# Patient Record
Sex: Male | Born: 1965 | Race: White | Hispanic: No | Marital: Married | Smoking: Former smoker
Health system: Southern US, Community
[De-identification: ages and names within clinical notes are randomized; demographics above are authoritative.]

## PROBLEM LIST (undated history)

## (undated) DIAGNOSIS — M5136 Other intervertebral disc degeneration, lumbar region: Secondary | ICD-10-CM

## (undated) DIAGNOSIS — F419 Anxiety disorder, unspecified: Secondary | ICD-10-CM

## (undated) DIAGNOSIS — R7303 Prediabetes: Secondary | ICD-10-CM

## (undated) DIAGNOSIS — K759 Inflammatory liver disease, unspecified: Secondary | ICD-10-CM

## (undated) DIAGNOSIS — B1910 Unspecified viral hepatitis B without hepatic coma: Secondary | ICD-10-CM

## (undated) DIAGNOSIS — K219 Gastro-esophageal reflux disease without esophagitis: Secondary | ICD-10-CM

## (undated) DIAGNOSIS — N400 Enlarged prostate without lower urinary tract symptoms: Secondary | ICD-10-CM

## (undated) DIAGNOSIS — M51369 Other intervertebral disc degeneration, lumbar region without mention of lumbar back pain or lower extremity pain: Secondary | ICD-10-CM

## (undated) HISTORY — PX: KNEE ARTHROSCOPY: SHX127

## (undated) HISTORY — PX: CERVICAL DISCECTOMY: SHX98

## (undated) HISTORY — PX: INGUINAL HERNIA REPAIR: SHX194

## (undated) HISTORY — PX: LIPOMA EXCISION: SHX5283

## (undated) HISTORY — PX: TONSILLECTOMY: SUR1361

## (undated) HISTORY — PX: LUMBAR DISC SURGERY: SHX700

---
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
## 2003-08-03 HISTORY — PX: CERVICAL DISCECTOMY: SHX98

## 2004-07-02 IMAGING — CR DG CHEST 2V
3 series · 3 of 3 positions shown · non-contrast
Comparison: none

CLINICAL DATA: 38 year-old for pre-op respiratory exam.  Herniated disc at C6-7.
 TWO VIEWS OF THE CHEST: 
 Two views of the chest without prior studies for comparison demonstrate cardiac silhouette, mediastinal and hilar contours to be within normal limits.  No acute pulmonary findings.  There is a vague rounded nodular density at the left lung base, but I think this is most likely patient?s nipple, but given his history of smoking I would recommend a follow-up PA chest with nipple markers to confirm this.

[view not recorded (1 of 3)]
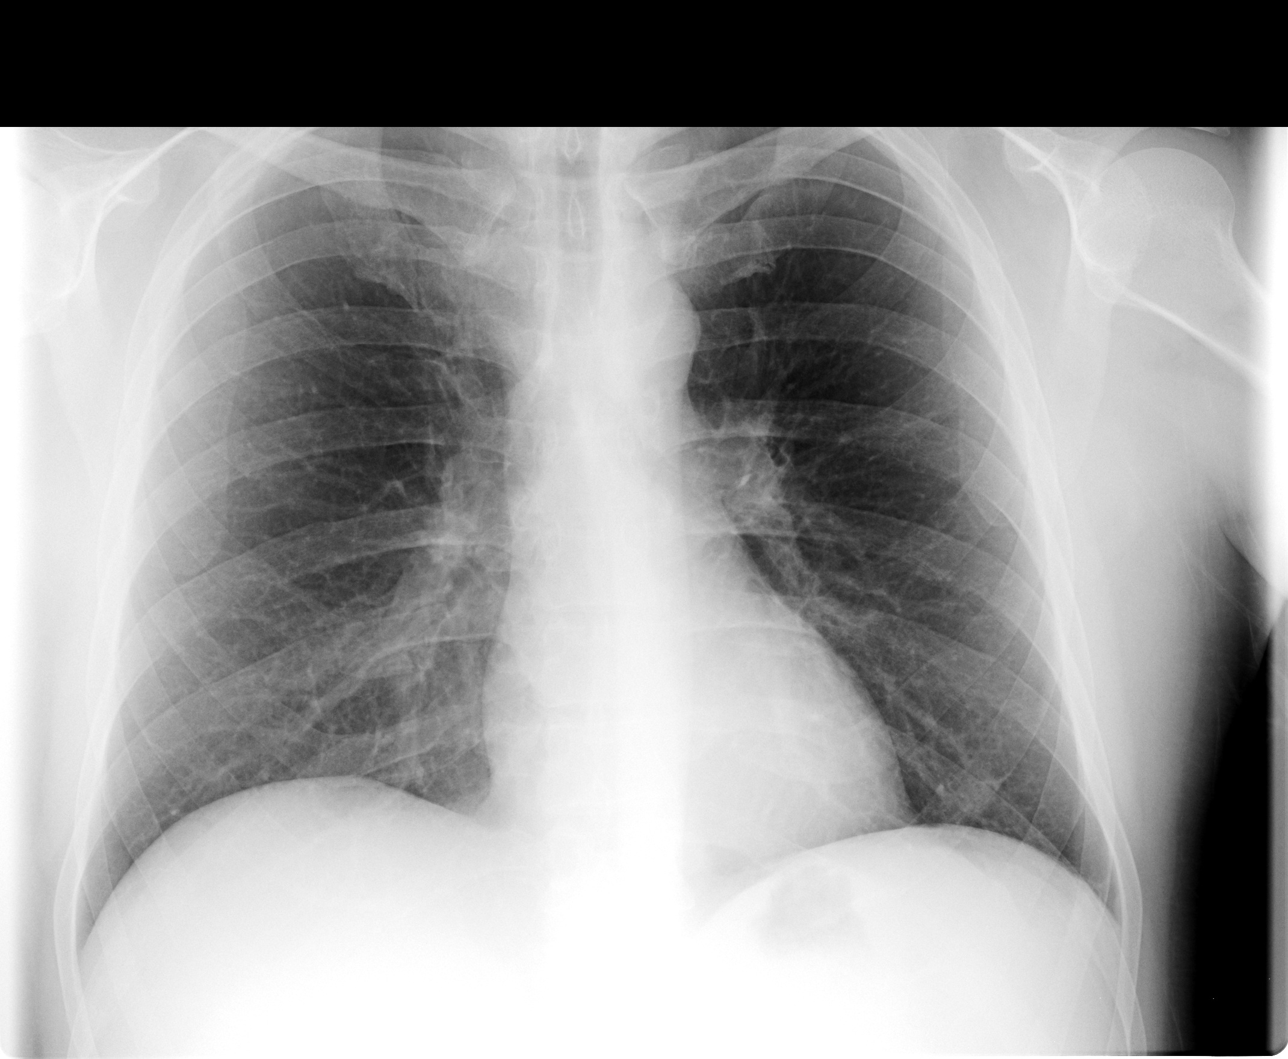

[view not recorded (2 of 3)]
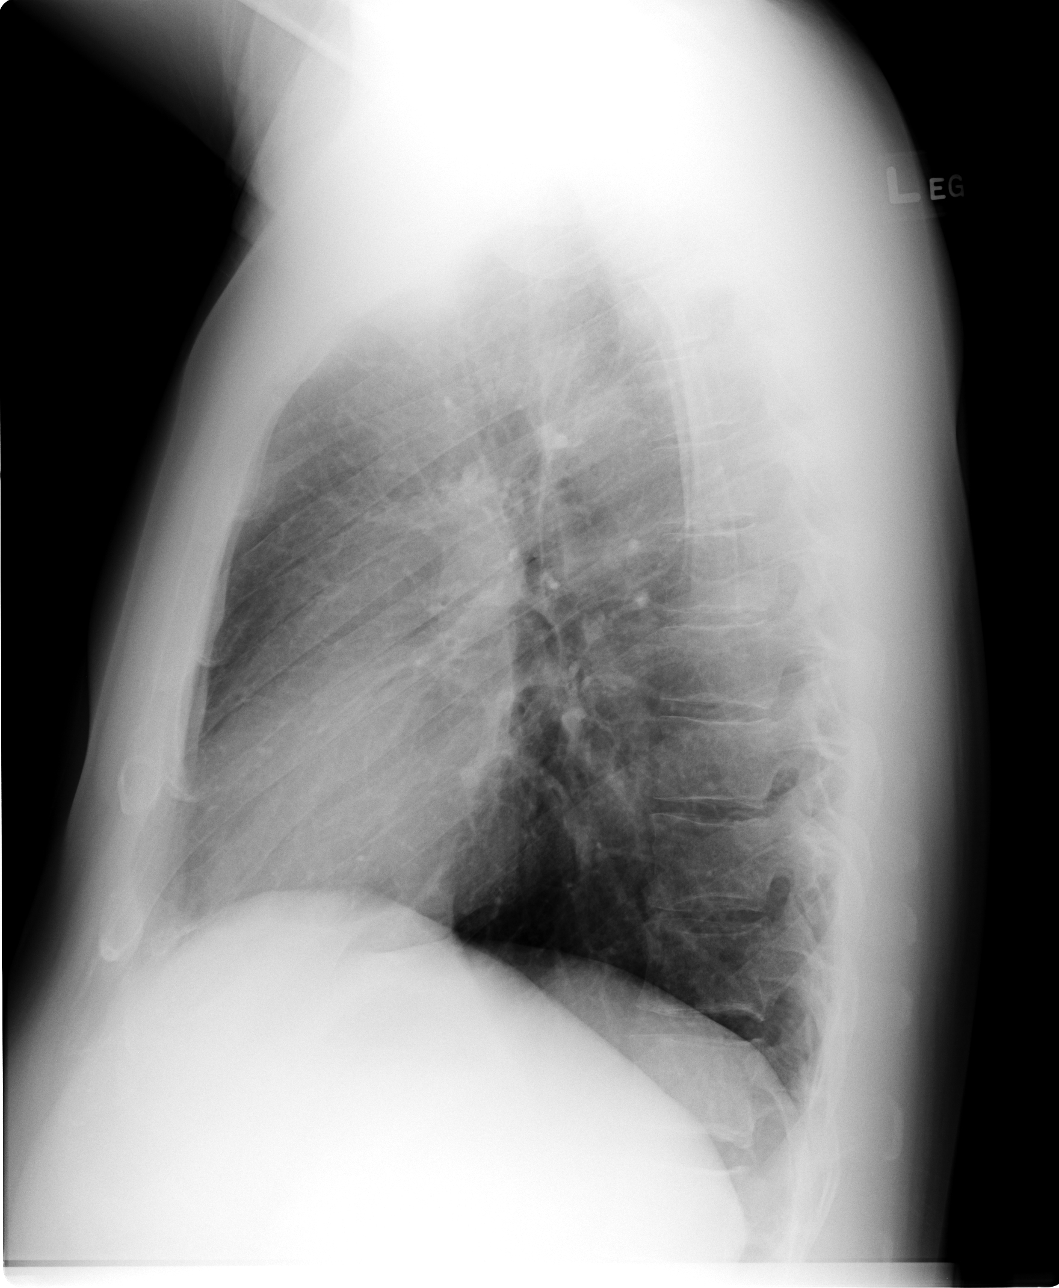

[view not recorded (3 of 3)]
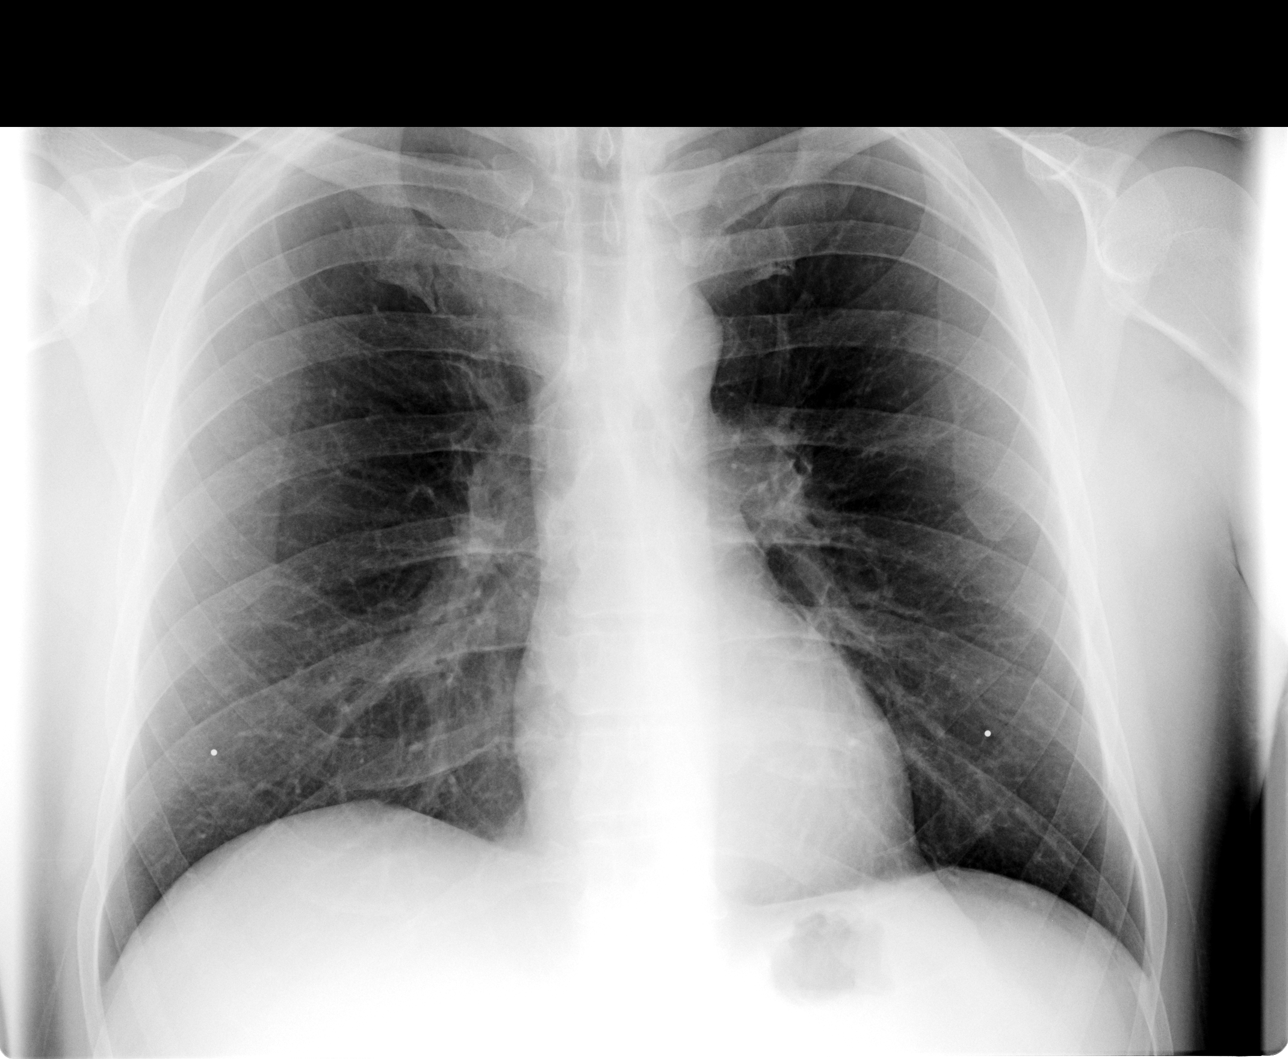

[3 of 3 positions shown; findings below may reference images not displayed]

IMPRESSION: 1. No acute cardiopulmonary findings.
 2. Rounded nodular density at the left lung base is most likely patient?s left nipple but I would recommend a follow-up PA chest with nipple markers to confirm this.

## 2004-07-03 ENCOUNTER — Ambulatory Visit (HOSPITAL_COMMUNITY): Admission: RE | Admit: 2004-07-03 | Discharge: 2004-07-04 | Payer: Self-pay | Admitting: Orthopaedic Surgery

## 2007-06-08 IMAGING — CT CT ABD-PELV W/O CM
3 of 7 series · 13 of 42 positions shown, 19 images · IV contrast (CONTRAST)
Comparison: NONE

CLINICAL DATA: Abdominal cramps and constipation. 

CT ABDOMEN AND PELVIS WITHOUT AND WITH INTRAVENOUS AND FOLLOWING 
ORAL  CONTRAST
TECHNIQUE: Multiple axial 5.0-mm-thick slices at 5.0-mm 
intervals were obtained from the lung base through the pelvis 
following the intravenous administration of 97 cc of Optiray 350 
at a rate of 3 cc per second.  Oral contrast was administered as 
well.  Arterial and venous phase imaging was obtained in the upper 
abdomen with delayed images obtained through the pelvis.

[Series 4: venous · axial · portal-venous · 0.83mm/px · z∈[+693,+1043]mm · 5 of 106 slices shown, 10 images]
[im 18/106  soft-tissue]
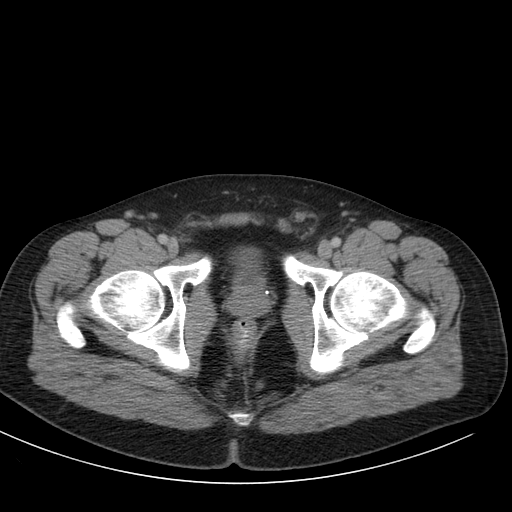
[im 18/106  bone]
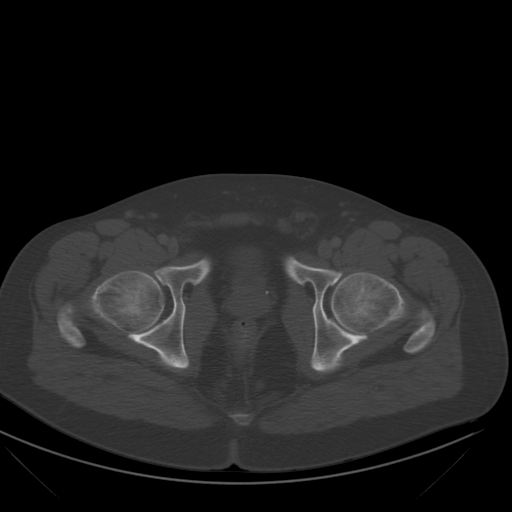
[im 36/106  soft-tissue]
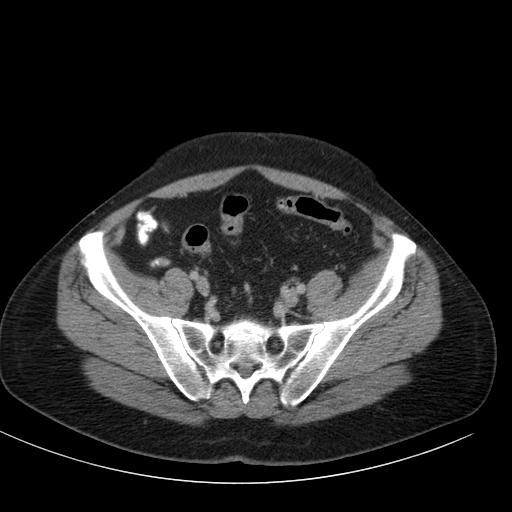
[im 36/106  lung]
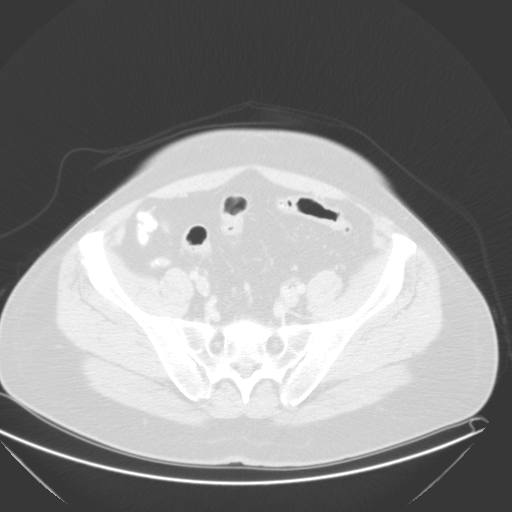
[im 53/106  soft-tissue]
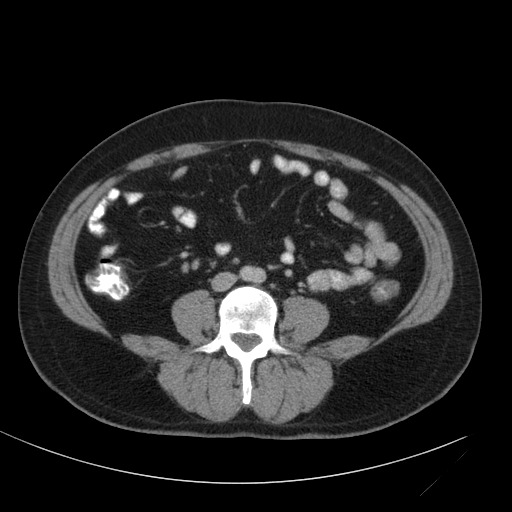
[im 53/106  lung]
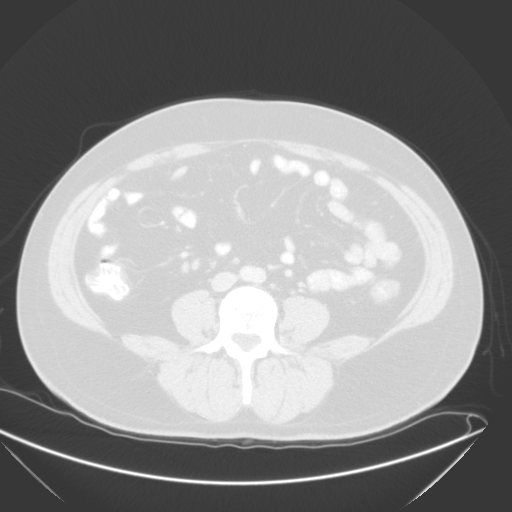
[im 71/106  soft-tissue]
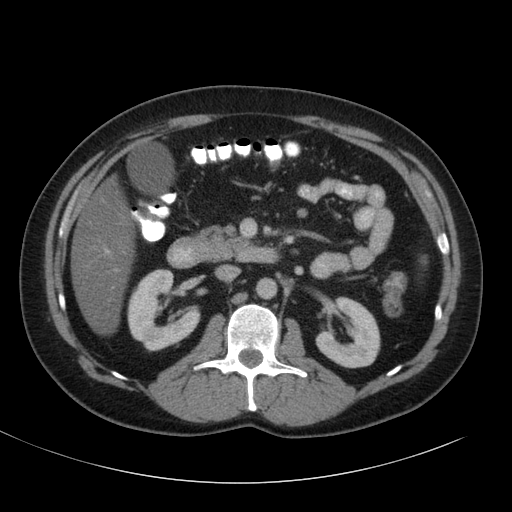
[im 71/106  lung]
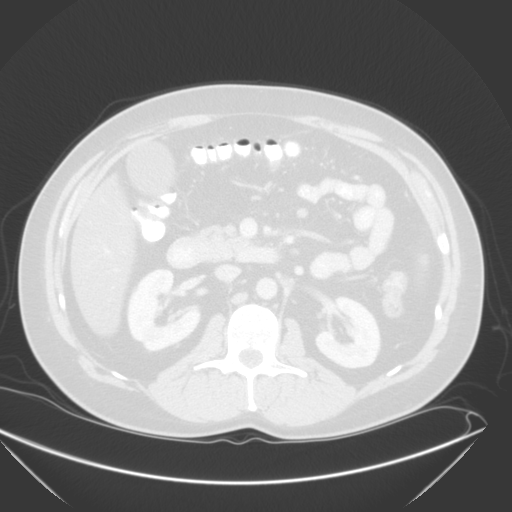
[im 88/106  soft-tissue]
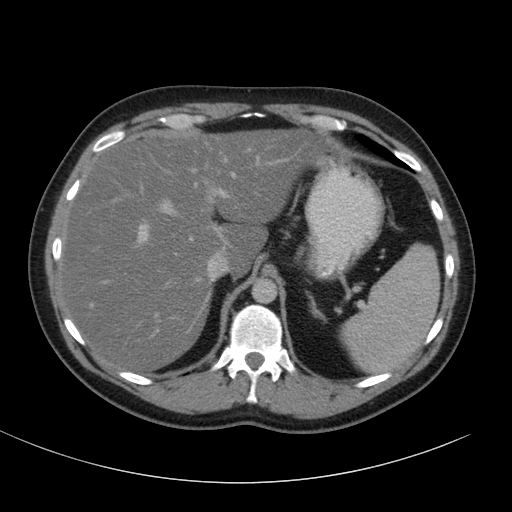
[im 88/106  lung]
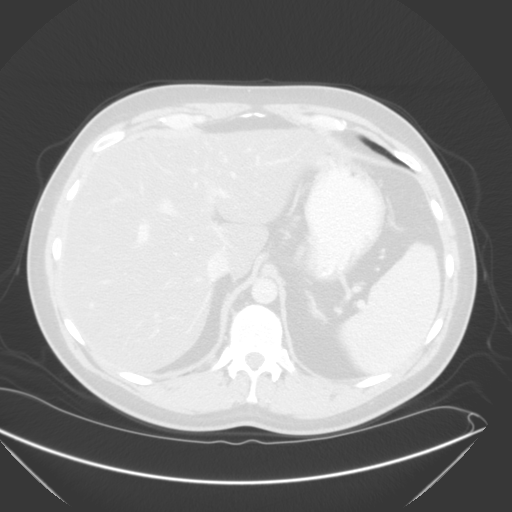

[Series 9: delays · axial · 0.83mm/px · z∈[+692,+1012]mm · 5 of 97 slices shown]
[im 17/97  soft-tissue]
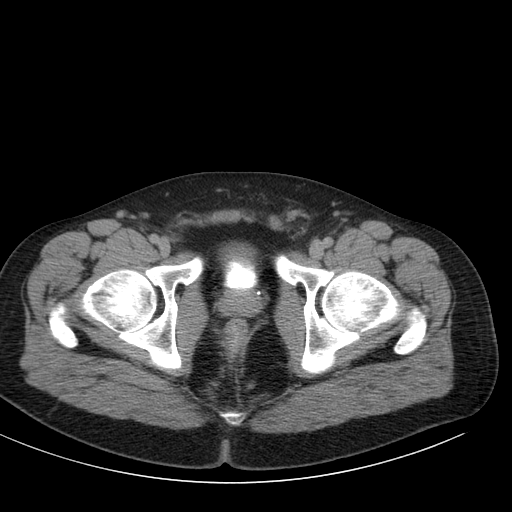
[im 33/97  soft-tissue]
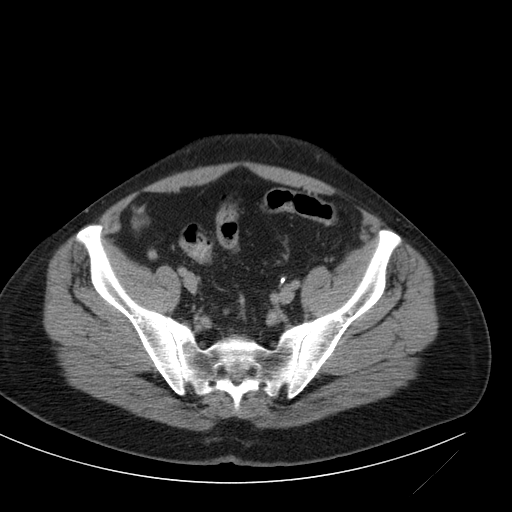
[im 49/97  soft-tissue]
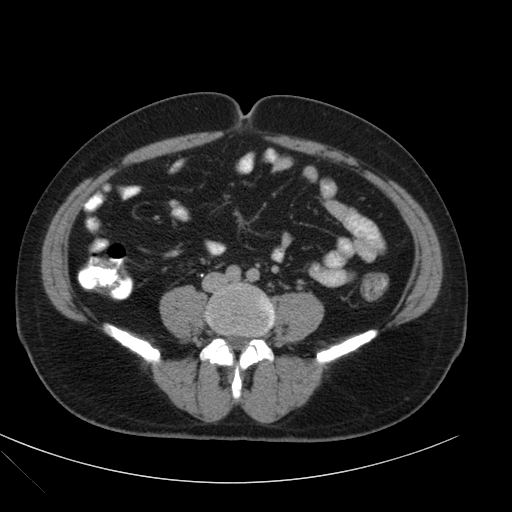
[im 65/97  soft-tissue]
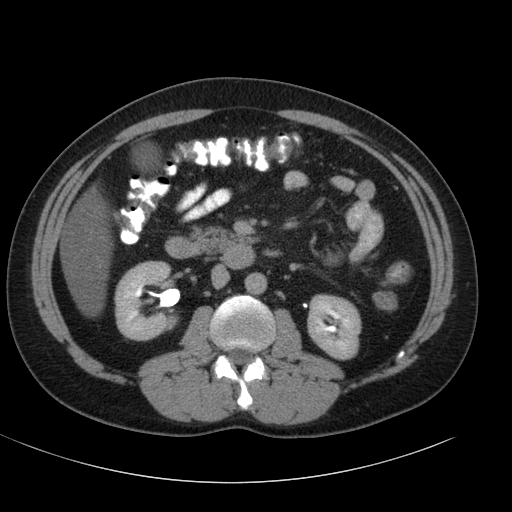
[im 81/97  soft-tissue]
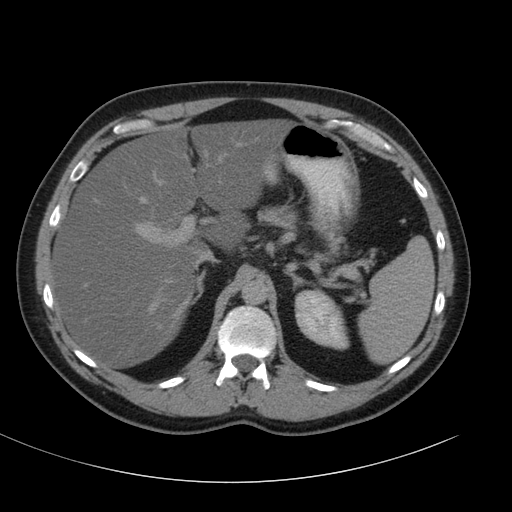

[coronals · coronal · 1.02mm/px · 3 of 76 slices shown, 4 images]
[im 26/76  soft-tissue]
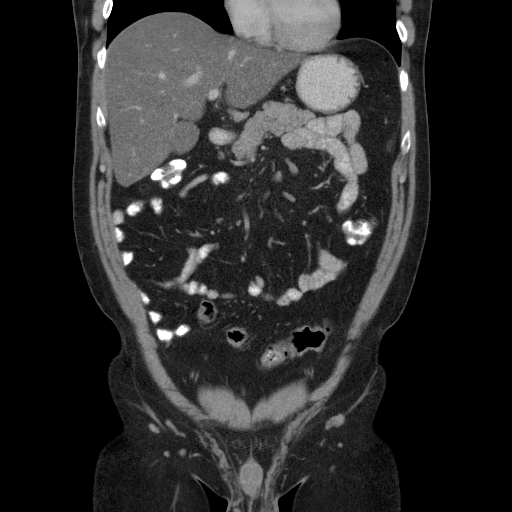
[im 34/76  soft-tissue]
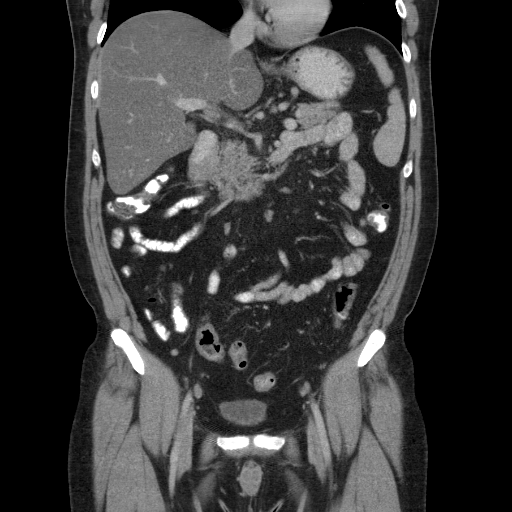
[im 34/76  bone]
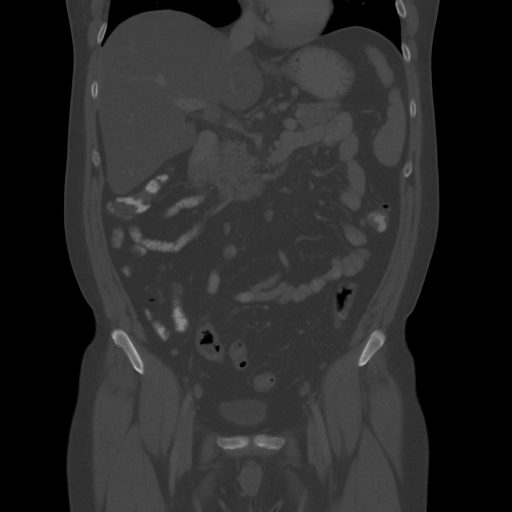
[im 42/76  soft-tissue]
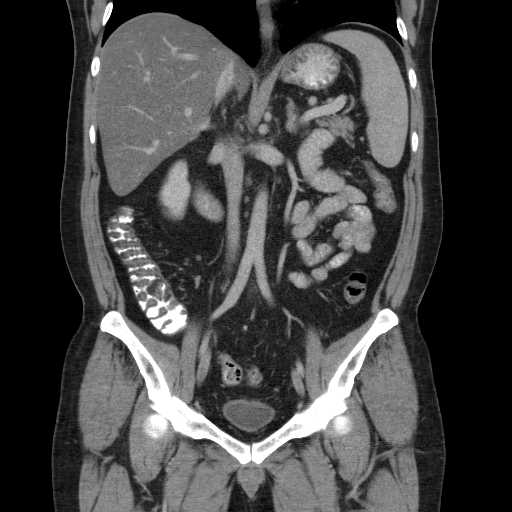

[13 of 42 positions shown; findings below may reference images not displayed]

FINDINGS: No gallstones or renal calculi.  Diffuse low density in 
the liver without focal mass is noted.  The pancreas and spleen 
are unremarkable.  No upper abdominal or retroperitoneal mass, 
adenopathy, or aneurysm.  No adrenal or renal mass or renal 
obstruction.  No bowel, mesenteric, pelvic, or inguinal mass, 
adenopathy, or inflammatory process.  No evidence of appendicitis, 
diverticulitis, hernia, or bowel obstruction. No lung base mass, 
infiltrate, edema, or effusion. No lytic or blastic lesions are 
identified.
IMPRESSION: Low density in the liver consistent with fatty 
infiltration, otherwise negative study. EIMY 
electronically reviewed on [DATE] Dict Date: [DATE]  Tran 
Date:  [DATE] DAS  JLM

## 2007-06-23 ENCOUNTER — Ambulatory Visit: Payer: Self-pay | Admitting: Gastroenterology

## 2007-06-23 LAB — CONVERTED CEMR LAB
HEP B PCR: 1680 — ABNORMAL HIGH (ref <100)
Hgb A1c MFr Bld: 5.3 % (ref 4.6–6.0)
INR: 1 (ref 0.8–1.0)
Prothrombin Time: 12.1 s (ref 10.9–13.3)
aPTT: 22.6 s (ref 21.7–29.8)

## 2007-08-09 ENCOUNTER — Ambulatory Visit: Payer: Self-pay | Admitting: Gastroenterology

## 2007-08-09 LAB — CONVERTED CEMR LAB
Basophils Absolute: 0 10*3/uL (ref 0.0–0.1)
Basophils Relative: 0.1 % (ref 0.0–1.0)
Eosinophils Absolute: 0.2 10*3/uL (ref 0.0–0.6)
Eosinophils Relative: 2.7 % (ref 0.0–5.0)
HCT: 45.8 % (ref 39.0–52.0)
Hemoglobin: 16.2 g/dL (ref 13.0–17.0)
Lymphocytes Relative: 41.4 % (ref 12.0–46.0)
MCHC: 35.3 g/dL (ref 30.0–36.0)
MCV: 91.4 fL (ref 78.0–100.0)
Monocytes Absolute: 0.5 10*3/uL (ref 0.2–0.7)
Monocytes Relative: 5.4 % (ref 3.0–11.0)
Neutro Abs: 4.5 10*3/uL (ref 1.4–7.7)
Neutrophils Relative %: 50.4 % (ref 43.0–77.0)
Platelets: 220 10*3/uL (ref 150–400)
RBC: 5.01 M/uL (ref 4.22–5.81)
RDW: 11.3 % — ABNORMAL LOW (ref 11.5–14.6)
TSH: 0.95 microintl units/mL (ref 0.35–5.50)
WBC: 8.8 10*3/uL (ref 4.5–10.5)

## 2007-10-12 ENCOUNTER — Ambulatory Visit: Payer: Self-pay | Admitting: Gastroenterology

## 2007-10-16 DIAGNOSIS — B1910 Unspecified viral hepatitis B without hepatic coma: Secondary | ICD-10-CM | POA: Insufficient documentation

## 2007-10-17 ENCOUNTER — Ambulatory Visit: Payer: Self-pay | Admitting: Gastroenterology

## 2007-10-26 ENCOUNTER — Ambulatory Visit: Payer: Self-pay | Admitting: Gastroenterology

## 2007-10-26 ENCOUNTER — Encounter: Payer: Self-pay | Admitting: Gastroenterology

## 2007-10-30 DIAGNOSIS — F411 Generalized anxiety disorder: Secondary | ICD-10-CM | POA: Insufficient documentation

## 2007-10-30 DIAGNOSIS — B181 Chronic viral hepatitis B without delta-agent: Secondary | ICD-10-CM | POA: Insufficient documentation

## 2007-10-30 DIAGNOSIS — K7689 Other specified diseases of liver: Secondary | ICD-10-CM

## 2007-10-30 DIAGNOSIS — D126 Benign neoplasm of colon, unspecified: Secondary | ICD-10-CM

## 2007-11-21 ENCOUNTER — Ambulatory Visit: Payer: Self-pay | Admitting: Gastroenterology

## 2007-12-07 ENCOUNTER — Ambulatory Visit: Payer: Self-pay | Admitting: Gastroenterology

## 2007-12-18 DIAGNOSIS — K219 Gastro-esophageal reflux disease without esophagitis: Secondary | ICD-10-CM

## 2007-12-18 DIAGNOSIS — Z87898 Personal history of other specified conditions: Secondary | ICD-10-CM | POA: Insufficient documentation

## 2007-12-29 ENCOUNTER — Encounter (INDEPENDENT_AMBULATORY_CARE_PROVIDER_SITE_OTHER): Payer: Self-pay | Admitting: Interventional Radiology

## 2007-12-29 ENCOUNTER — Ambulatory Visit (HOSPITAL_COMMUNITY): Admission: RE | Admit: 2007-12-29 | Discharge: 2007-12-29 | Payer: Self-pay | Admitting: Obstetrics and Gynecology

## 2007-12-29 IMAGING — US US BIOPSY
1 series · 10 of 10 positions shown · non-contrast
Comparison: none

Clinical: Chronic hepatitis B; request is made for random core
liver biopsy

ULTRASOUND-GUIDED RANDOM CORE LIVER BIOPSY:
An ultrasound guided liver biopsy was thoroughly discussed with the
patient and questions were answered. The benefits, risks,
alternatives, and complications were also discussed. The patient
understands and wishes to proceed with the procedure. A verbal as
well as written consent was obtained.
Survey ultrasound of the liver was performed and an appropriate
skin entry site was determined. Skin site was marked, prepped with
Betadine, and draped in usual sterile fashion, and infiltrated
locally with 1% Lidocaine. 2 mg Intravenous Fentanyl and  100 mcg
IV Versed were administered as conscious sedation for 10 minutes
during continuous cardiorespiratory monitoring by the radiology RN.
A 17 gauge Trocar needle was advanced under ultrasound guidance
into the liver.  4 coaxial 18 gauge core samples were then obtained
through the guide needle. The guide needle was removed. Post
procedure scans demonstrate no apparent complication.

[Series 1: unknown · 0.33mm/px · 10 of 10 slices shown]
[im 1/10]
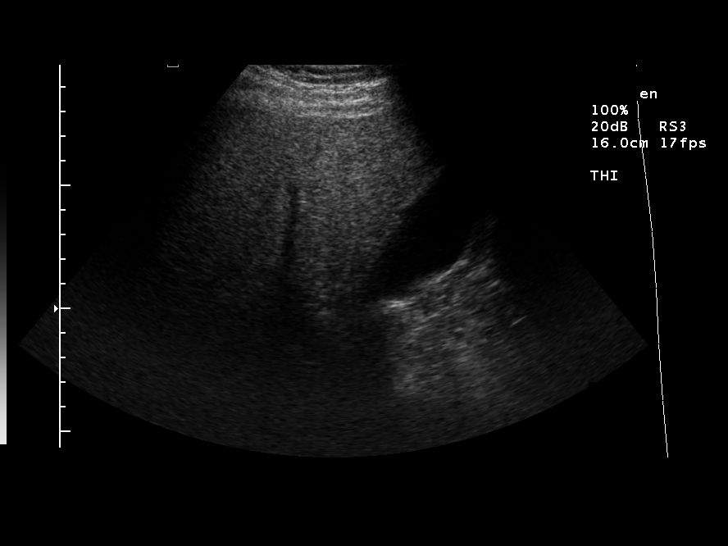
[im 2/10]
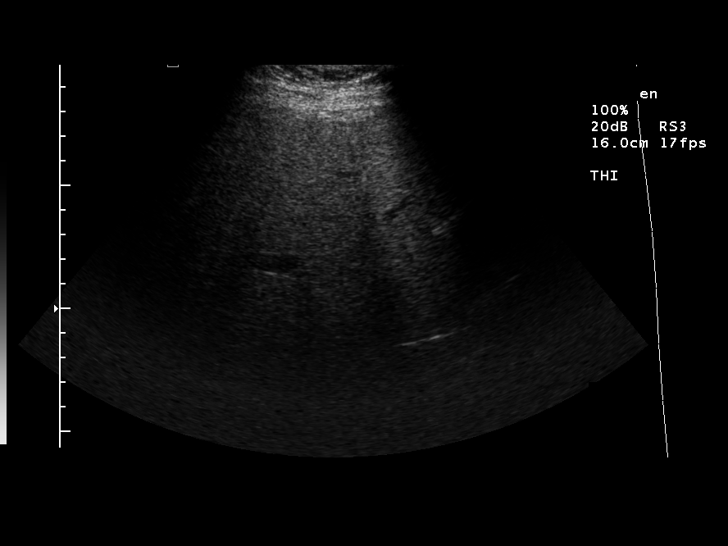
[im 3/10]
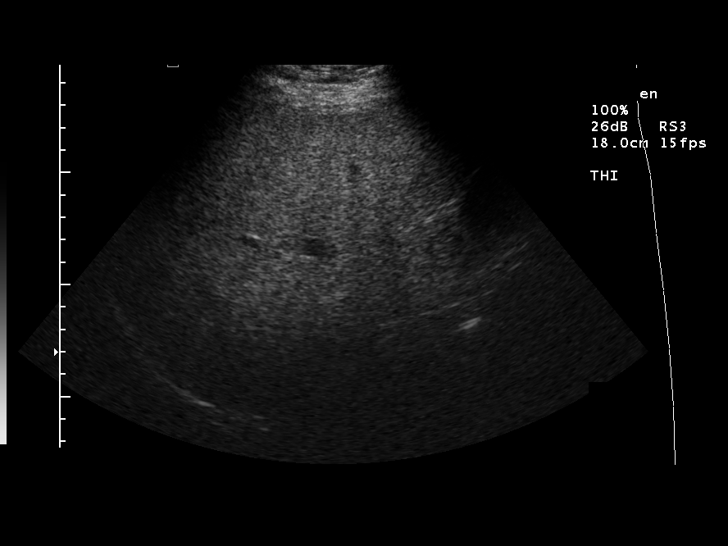
[im 4/10]
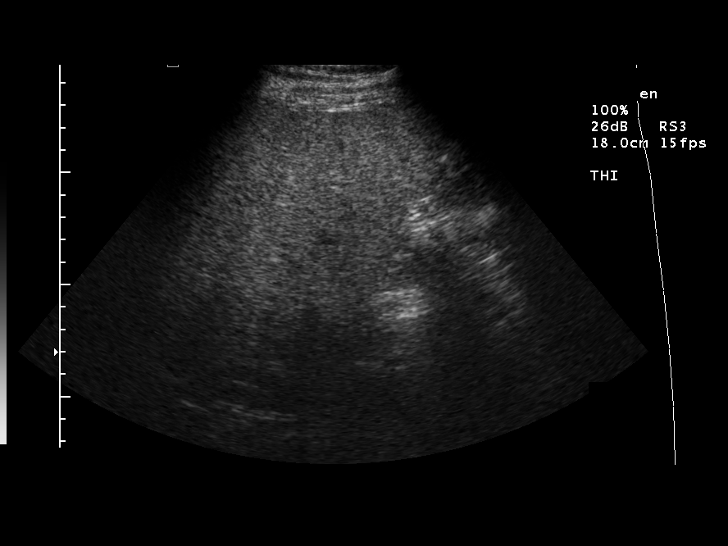
[im 5/10]
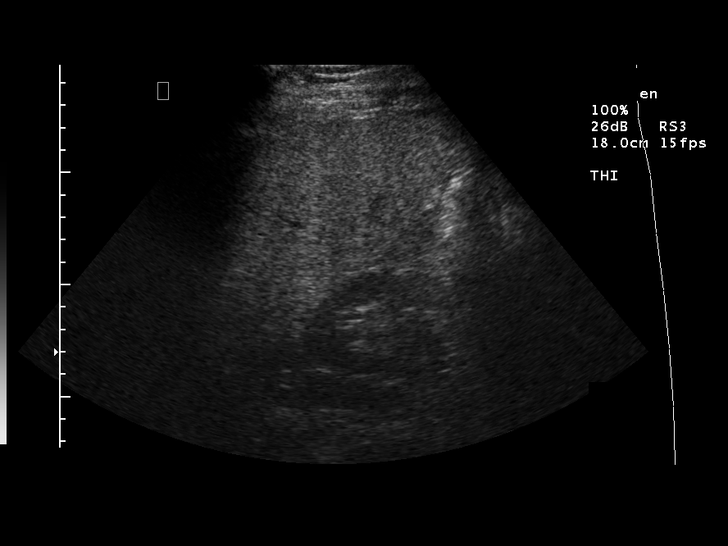
[im 6/10]
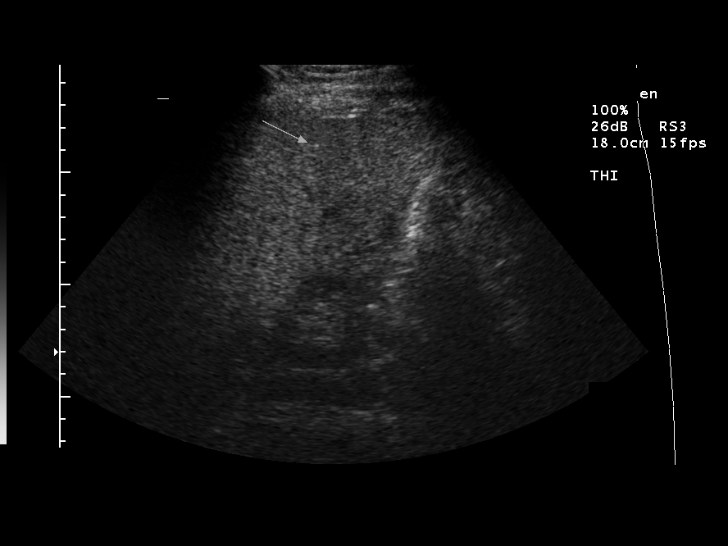
[im 7/10]
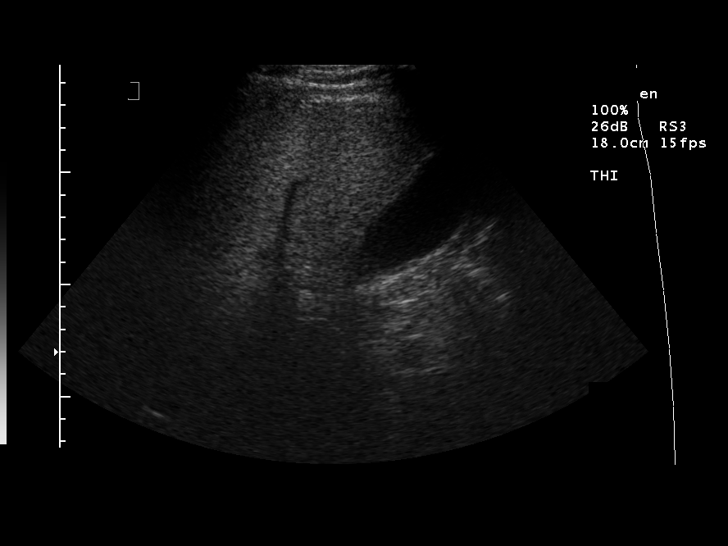
[im 8/10]
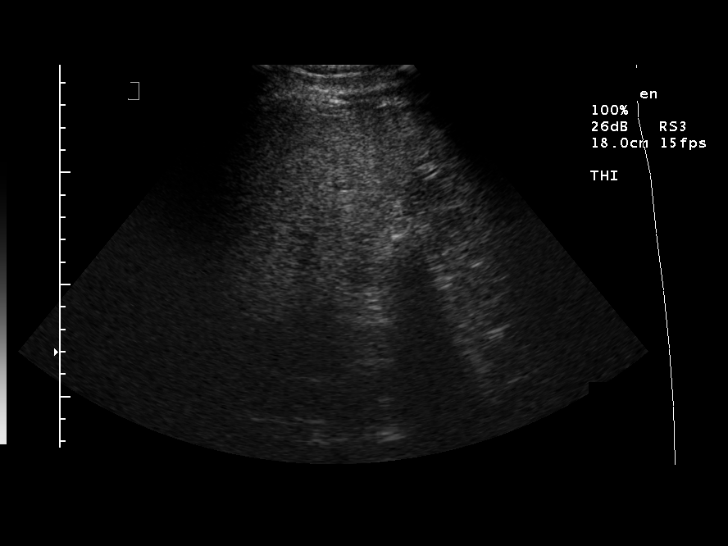
[im 9/10]
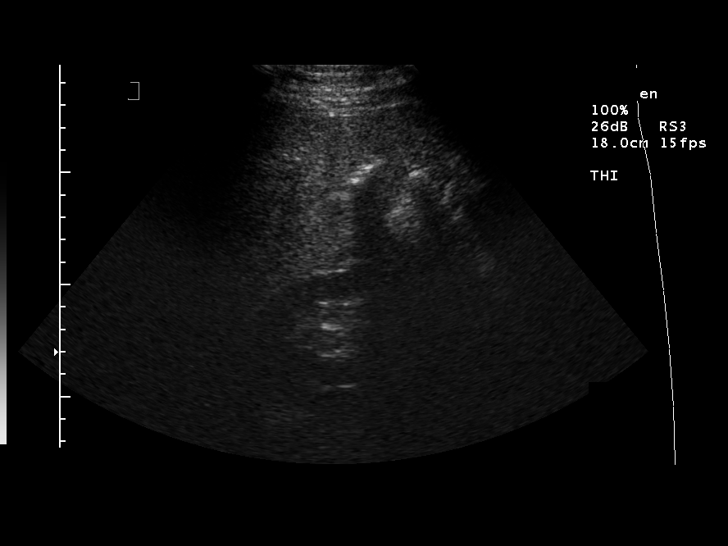
[im 10/10]
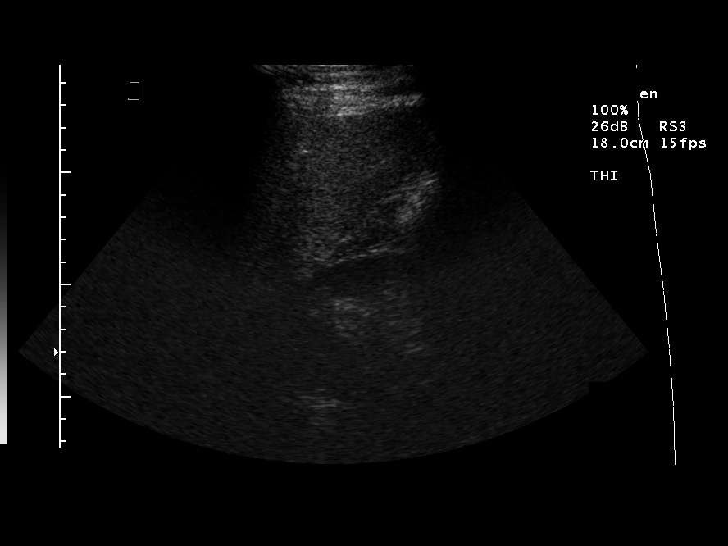

[10 of 10 positions shown; findings below may reference images not displayed]

IMPRESSION: 1. Technically successful ultrasound guided random core liver
biopsy.

Read by: MELTA.-MELTA

## 2008-02-08 ENCOUNTER — Ambulatory Visit: Payer: Self-pay | Admitting: Gastroenterology

## 2008-06-13 ENCOUNTER — Ambulatory Visit: Payer: Self-pay | Admitting: Gastroenterology

## 2008-11-26 ENCOUNTER — Ambulatory Visit: Payer: Self-pay | Admitting: Gastroenterology

## 2008-12-05 ENCOUNTER — Ambulatory Visit (HOSPITAL_COMMUNITY): Admission: RE | Admit: 2008-12-05 | Discharge: 2008-12-05 | Payer: Self-pay | Admitting: Gastroenterology

## 2008-12-05 IMAGING — US US ABDOMEN COMPLETE
1 series · 14 of 25 positions shown · non-contrast
Comparison: None

CLINICAL DATA: Hepatitis B.  Fatty liver.

COMPLETE ABDOMINAL ULTRASOUND

[Series 1: us abdomen complete · 0.35mm/px · 14 of 65 slices shown]
[im 1/65]
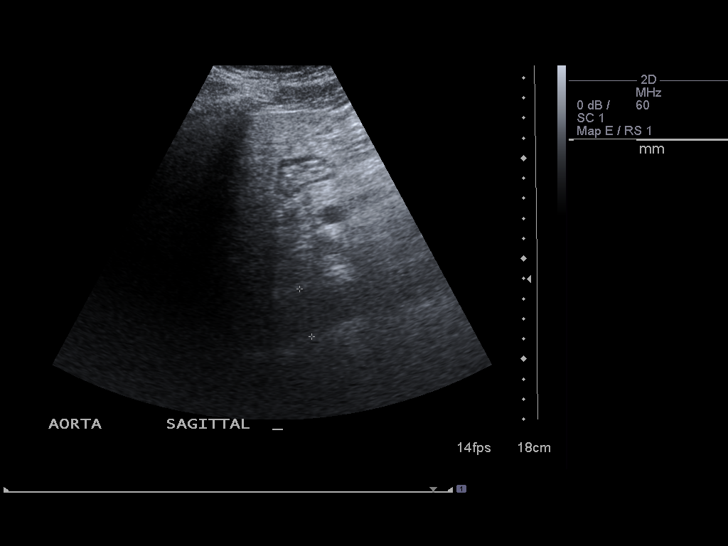
[im 6/65]
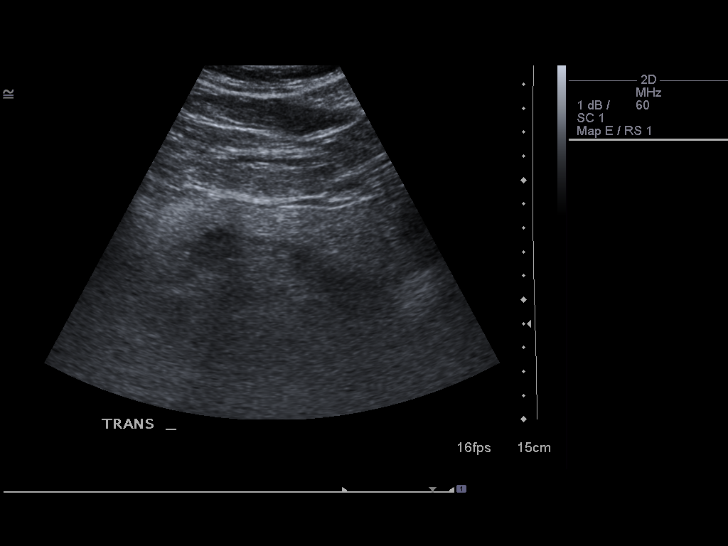
[im 11/65]
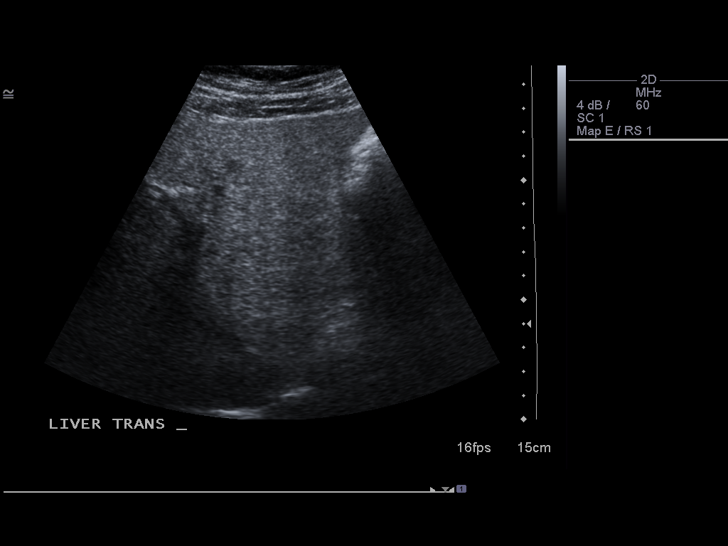
[im 17/65]
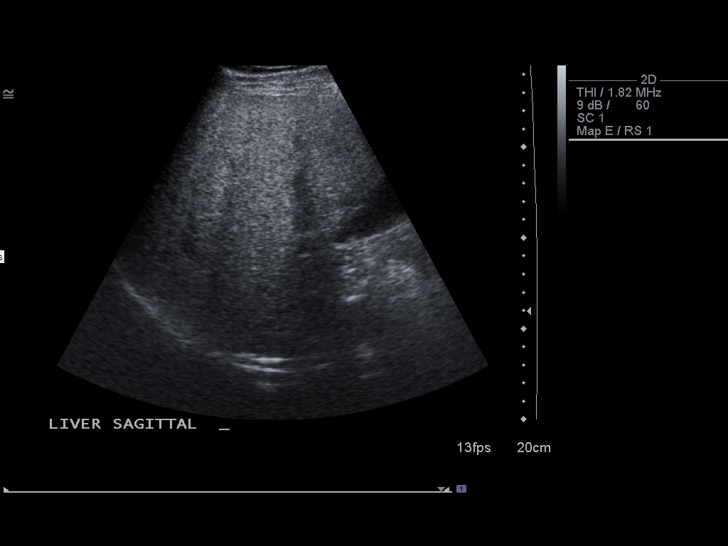
[im 22/65]
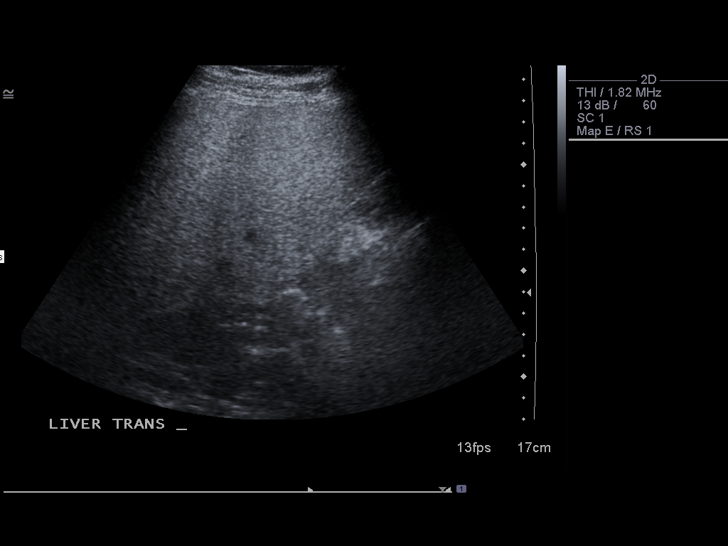
[im 25/65]
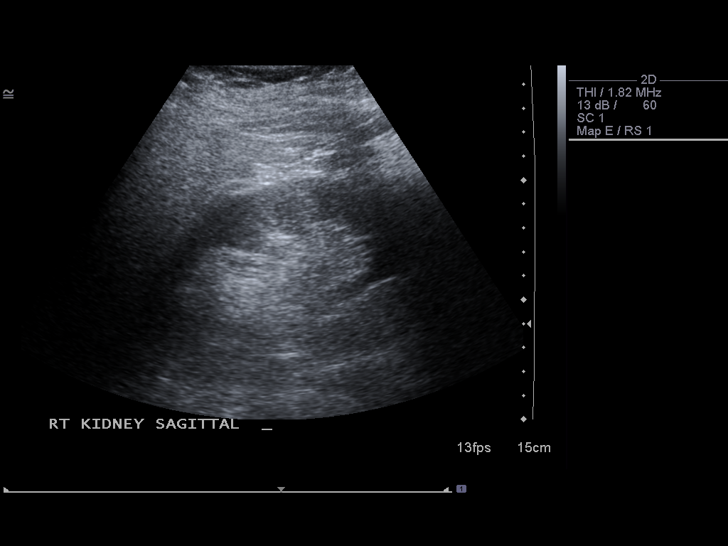
[im 30/65]
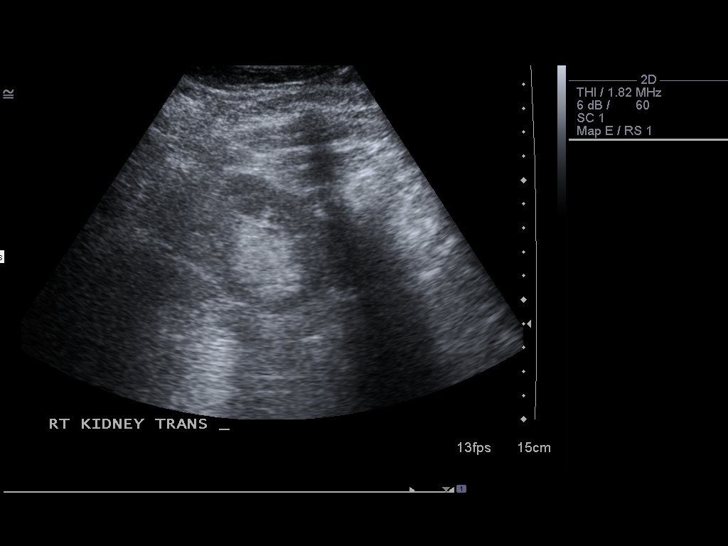
[im 35/65]
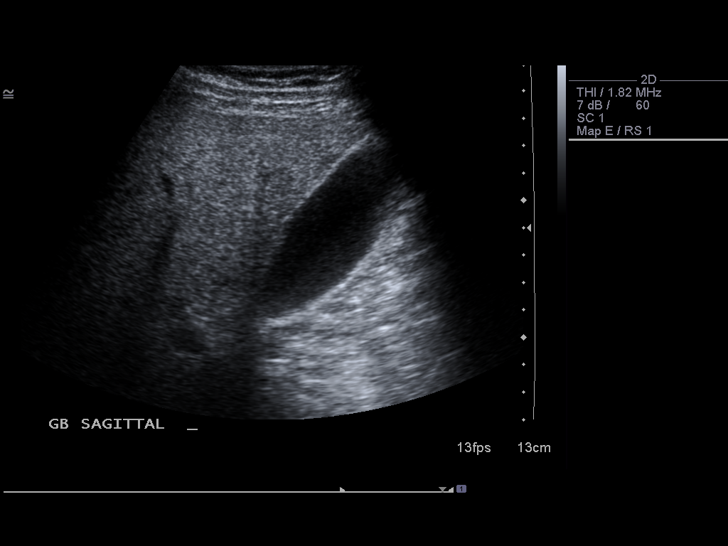
[im 41/65]
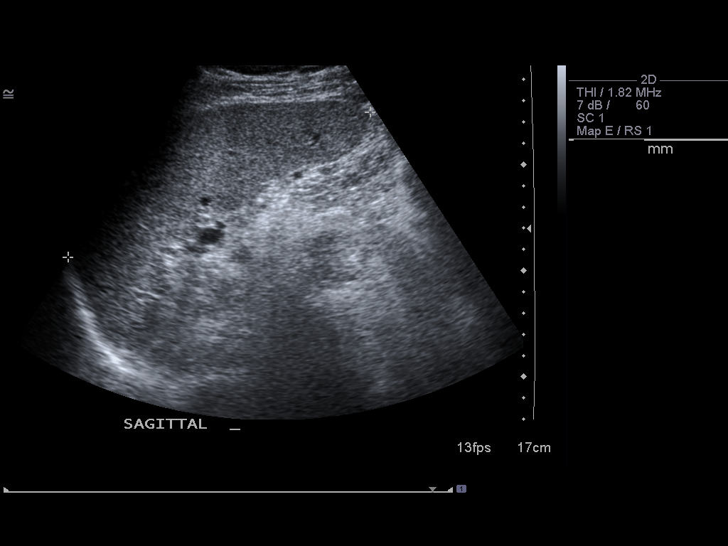
[im 43/65]
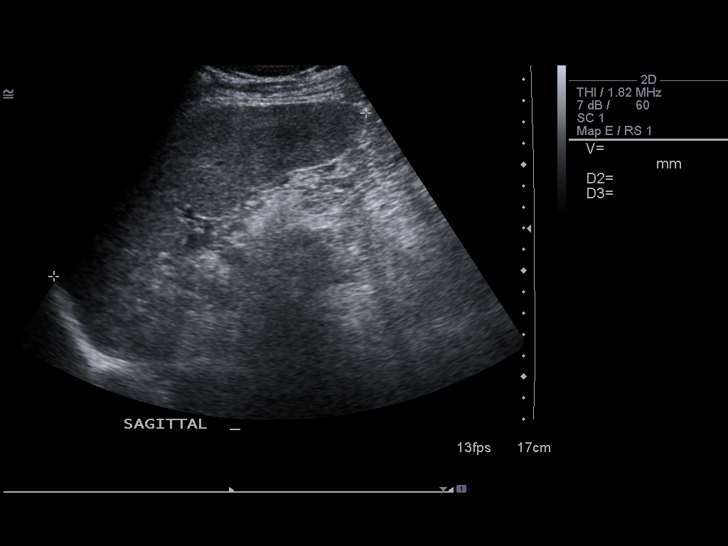
[im 49/65]
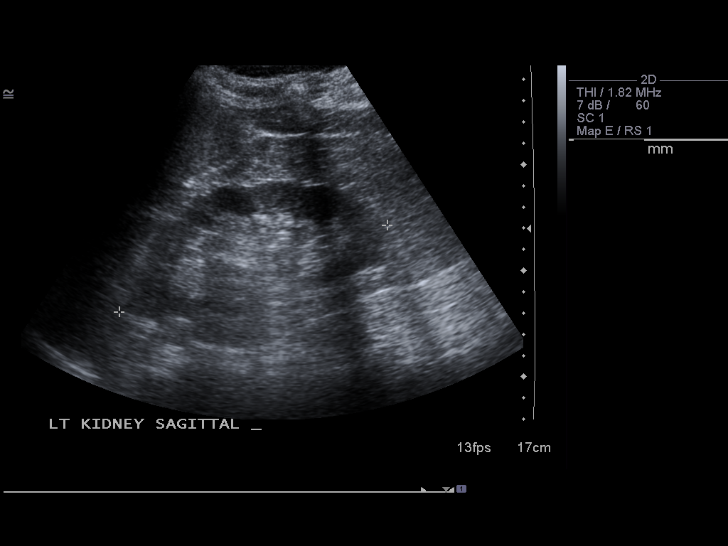
[im 54/65]
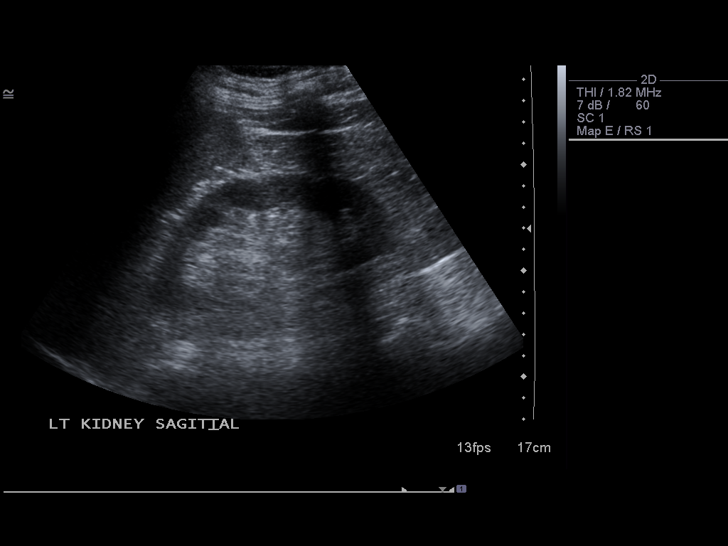
[im 59/65]
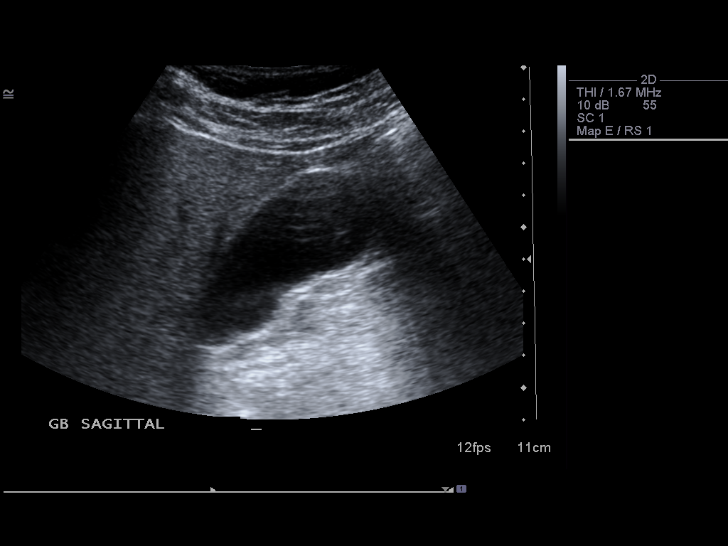
[im 65/65]
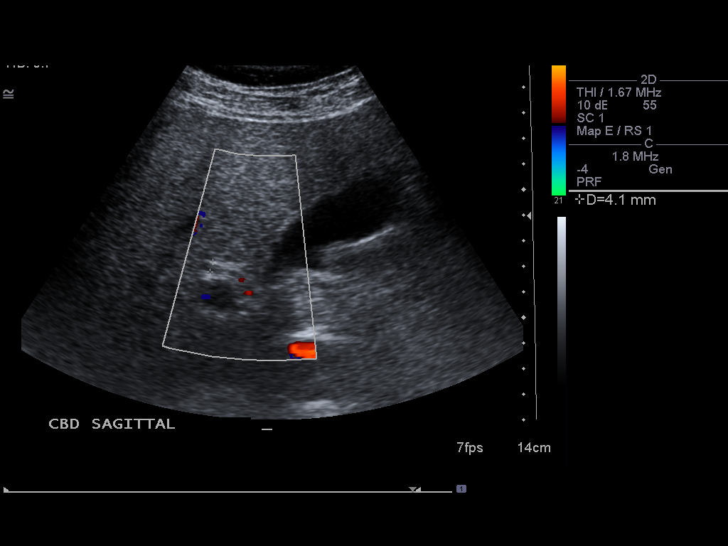

[14 of 25 positions shown; findings below may reference images not displayed]

FINDINGS: Gallbladder:  Gallbladder is sonographically normal without sludge
or gallstones with normal 3 mm wall thickness.

Common bile duct:  No dilated intrahepatic or extrahepatic bile
ducts seen with common bile duct measuring normally at 3 mm.

Liver:  Liver size is normal.  Increased liver echogenicity is
consistent with moderate fatty change with no focal lesion
identified.

IVC:  Sonographically normal.

Pancreas:  Sonographically normal.

Spleen:  Slight splenomegaly measuring 16.6 cm long (splenic volume
651 cc).

Right Kidney:  Sonographically normal measuring 12.1 cm long.

Left Kidney:  Sonographically normal measuring 13.3 cm long.

Abdominal aorta:  Sonographically normal with proximal abdominal
aortic diameter 2.5 cm.

Other Findings:  No free fluid noted.
IMPRESSION: 1.  Moderate diffuse fatty infiltration liver.
2.  Slight splenomegaly.
3.  Otherwise, negative.

## 2009-05-01 ENCOUNTER — Ambulatory Visit: Payer: Self-pay | Admitting: Gastroenterology

## 2009-12-25 ENCOUNTER — Encounter: Payer: Self-pay | Admitting: Gastroenterology

## 2009-12-25 ENCOUNTER — Ambulatory Visit: Payer: Self-pay | Admitting: Gastroenterology

## 2010-09-01 NOTE — Consult Note (Signed)
Summary: [facility]   Imported By: Lester [place] 01/14/2010 08:25:21  _____________________________________________________________________  External Attachment:    Type:   Image     Comment:   External Document

## 2010-12-15 NOTE — Letter (Signed)
June 23, 2007    Ann Held   RE:  LIOR, HOEN  MRN:  [id number]  /  DOB:  1966-02-22   Dear Mr. Epifanio Lesches:   It is my pleasure to have treated you recently as a new patient in my  office.  I appreciate your confidence and the opportunity to participate  in your care.   Since I do have a busy inpatient endoscopy schedule and office schedule,  my office hours vary weekly.  I am, however, available for emergency  calls every day through my office.  If I cannot promptly meet an urgent  office appointment, another one of our gastroenterologists will be able  to assist you.   My well-trained staff are prepared to help you at all times.  For  emergencies after office hours, a physician from our gastroenterology  section is always available through my 24-hour answering service.   While you are under my care, I encourage discussion of your questions  and concerns, and I will be happy to return your calls as soon as I am  available.   Once again, I welcome you as a new patient and I look forward to a happy  and healthy relationship.    Sincerely,      Barbette Hair. Arlyce Dice, MD,FACG  Electronically Signed   RDK/MedQ  DD: 06/23/2007  DT: 06/23/2007  Job #: [id number]

## 2010-12-15 NOTE — Assessment & Plan Note (Signed)
 HEALTHCARE                         GASTROENTEROLOGY OFFICE NOTE   Troy Shelton, Troy Shelton                       MRN:          [id number]  DATE:11/21/2007                            DOB:          Oct 22, 1965    PROBLEM:  Hepatitis B.   Troy Shelton has returned for scheduled GI followup.  He underwent  colonoscopy because of diarrhea, which demonstrated a hyperplastic polyp  only.  Serologies for hepatitis B were positive.  Hepatitis B  quantitative DNA was 1680, and calculative DNA 319 (which is  unequivocally positive).  Hemoglobin A1c level was 5.3.   Troy Shelton main complaint is fatigue.  This is a constant problem.  He  otherwise is feeling well on examination.  Pulse was 72, blood pressure  132/72, weight 214.   IMPRESSION:  1. Chronic hepatitis B.  At this point he will be followed by Dr.      Foy [place] for this problem.  2. Elevated glucose (141).  His normal A1c level mitigates against      diabetes.   RECOMMENDATIONS:  No further recommendations.  He will return to  gastroenterology as needed.     Barbette Hair. Arlyce Dice, MD,FACG  Electronically Signed    RDK/MedQ  DD: 11/21/2007  DT: 11/21/2007  Job #: [id number]   cc:   Darrin Nipper, MD

## 2010-12-15 NOTE — Letter (Signed)
June 23, 2007    Dr. Darrin Nipper  [facility]  [address]  [place], [place] [address]   RE:  [name], SAMPLES  MRN:  [id number]  /  DOB:  Jul 02, 1966   Dear Dr. Dell Ponto:   Upon your kind referral, I had the pleasure of evaluating your patient  and I am pleased to offer my findings.  I saw Troy Shelton in the  office today.  Enclosed is a copy of my progress note that details my  findings and recommendations.   Thank you for the opportunity to participate in your patient's care.    Sincerely,      Barbette Hair. Arlyce Dice, MD,FACG  Electronically Signed    RDK/MedQ  DD: 06/23/2007  DT: 06/23/2007  Job #: [id number]

## 2010-12-15 NOTE — Assessment & Plan Note (Signed)
[facility]                         GASTROENTEROLOGY OFFICE NOTE   HUNTER, BACHAR                       MRN:          [id number]  DATE:06/23/2007                            DOB:          [birth year]    REASON FOR CONSULTATION:  Hepatitis B.   Troy Shelton is a pleasant 45 year old male referred through the courtesy  of Dr. Dell Ponto for evaluation.  He was seen at [facility] for abdominal  pain and diarrhea.  Abnormal liver tests incidentally were noted.  Serologies were positive for hepatitis B surface antigen.  He was  hepatitis B core antibody negative.  He underwent a CT scan that showed  fatty infiltration of the liver.  Troy Shelton GI complaints have  subsequently subsided.  There is no history of hepatitis blood  transfusions or IV drug use.  There is no family history of hepatitis.  Laboratory results are pertinent for a glucose of 141.  Troy Shelton  complains of occasional pyrosis for which he takes Nexium.  He still has  occasional periumbilical discomfort.   PAST MEDICAL HISTORY:  1. A ruptured disk in his neck.  2. Status post herniorrhaphy.   FAMILY HISTORY:  Noncontributory.   MEDICATIONS:  Flomax, Nexium, and sertraline.   He has no allergies.  He does not smoke.  He drinks rarely.  He is  married and works in [employer].   REVIEW OF SYSTEMS:  Positive for insomnia, night sweats, fatigue, and  excessive urination.   PHYSICAL EXAM:  He is a healthy-appearing male.  There is no stigmata of  liver disease, pulse 80, blood pressure 118/88, weight 227.  HEENT: EOMI.  PERRLA.  Sclerae are anicteric.  Conjunctivae are pink.  NECK:  Supple without thyromegaly, adenopathy or carotid bruits.  CHEST:  Clear to auscultation and percussion without adventitious  sounds.  CARDIAC:  Regular rhythm; normal S1 S2.  There are no murmurs, gallops  or rubs.  ABDOMEN:  Bowel sounds are normoactive.  Abdomen is soft, nontender and  nondistended.  There  are no abdominal masses, tenderness, splenic  enlargement or hepatomegaly.  EXTREMITIES:  Full range of motion.  No cyanosis, clubbing or edema.  RECTAL:  Deferred.   June 08, 2007, glucose 141, bilirubin 1.3, AST 30, ALT 73.  Albumin  and alkaline phosphatase were normal.   IMPRESSION:  Chronic hepatitis B.  Presumably, this represents a  sporadic infection since he has no risk factors for acquiring hepatitis  B.  At issue is whether he has a chronic active versus a chronic  persistent hepatitis.  His mild transaminitis could also be replacement  of hepatic steatosis.   RECOMMENDATIONS:  1. Check HBV, quantitative and qualitative DNA.  2. Check hemoglobin A1C.   I will make further recommendations pending the results of these studies  including consideration of a percutaneous liver biopsy.     Barbette Hair. Arlyce Dice, MD,FACG  Electronically Signed    RDK/MedQ  DD: 06/23/2007  DT: 06/23/2007  Job #: [id number]   cc:   Darrin Nipper, MD

## 2010-12-18 NOTE — Op Note (Signed)
Troy Shelton, Troy Shelton              ACCOUNT NO.:  [id number]   MEDICAL RECORD NO.:  [id number]          PATIENT TYPE:  OIB   LOCATION:  5036                         FACILITY:  [facility]   PHYSICIAN:  Sharolyn Douglas, M.D.        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   PROCEDURES:  1.  Anterior cervical diskectomy, C6-7.  2.  Anterior cervical arthrodesis, C6-7, placement of allograft prosthesis      spacer packed with local autogenous bone graft.  3.  Anterior cervical plating, C6-7, using the spinal Concept system.   SURGEON:  Sharolyn Douglas, M.D.   ASSISTANT:  Verlin Fester, P.A.   ANESTHESIA:  General endotracheal.   COMPLICATIONS:  None.   INDICATIONS:  The patient is a pleasant 45 year old male with severe  intractable pain felt to be secondary to a right C7 radiculopathy.  His MRI  scan shows a foraminal disk herniation at C6-7.  Because of his severe  symptomatology, he has elected to undergo ACDF at C6-7 in hopes of improving  his symptoms.  The risks, benefits and alternatives were extensively  reviewed and he elected to proceed.   DESCRIPTION OF PROCEDURE:  The patient was properly identified in the  holding area and taken to the operating room.  He underwent general  endotracheal anesthesia without difficulty.  Given prophylactic IV  antibiotics.  All bony prominences were padded.  He was carefully positioned  prone with the Mayfield headrest with the neck in slight extension.  The  neck was prepped and draped in the usual sterile fashion.  A small incision  was made in a transverse fashion at the left side of the neck at the level  of the cricoid cartilage.  Dissection was carried sharply through the  platysma.  The interval between the SCM and strap muscles medially was  developed down to the prevertebral space.  The esophagus, trachea and  carotid sheath were identified and protected at all times.  A spinal needle  was placed within the C6-7 disk space and x-ray confirmed this location.  Longus colli muscle elevated out over the C6-7 disk space bilaterally.  Deep  _________ retractor placed.  Diskectomy carried back to the posterior  longitudinal ligament.  The cartilaginous end-plates were removed using a  high-speed bur.  The uncovertebral joint was removed on the right side.  A  wide foraminotomy was created.  We removed several small disk fragments from  within the foramen.  The wound was irrigated.  The foramen was explored with  a nerve hook and we felt we had an excellent decompression.  We then placed  a 9 mm allograft prosthesis spacer packed with local autogenous bone graft  from the drill shavings.  This was carefully countersunk 1 mm.  We placed a  26 mm anterior cervical plate with four 13 mm screws.  We had good screw  purchase.  We ensured that the locking mechanism engaged.  The wound was  irrigated.  The esophagus, trachea and carotid sheath were examined.  No  apparent injuries.  Deep Penrose drain left in place.  Platysma closed with  interrupted 2-0 Vicryl.  Subcutaneous  layer close with interrupted 3-0 Vicryl followed by a running 4-0  subcuticular Vicryl suture on the skin edges.  Benzoin and Steri-Strips  placed.  Sterile dressing applied.  The patient was placed into a soft  cervical collar and extubated without difficulty.  Transferred to recovery  in stable condition.       MC/MEDQ  D:  07/03/2004  T:  07/04/2004  Job:  [id number]

## 2011-04-28 LAB — CBC
HCT: 48
Hemoglobin: 16.6
MCHC: 34.6
MCV: 92.2
Platelets: 182
RBC: 5.21
RDW: 13.2
WBC: 7.6

## 2011-04-28 LAB — APTT: aPTT: 27

## 2011-04-28 LAB — PROTIME-INR
INR: 1.1
Prothrombin Time: 13.9

## 2014-03-11 ENCOUNTER — Encounter: Payer: Self-pay | Admitting: Gastroenterology

## 2018-01-10 HISTORY — PX: LUMBAR DISC SURGERY: SHX700

## 2019-08-03 HISTORY — PX: COLONOSCOPY WITH ESOPHAGOGASTRODUODENOSCOPY (EGD): SHX5779

## 2021-07-28 ENCOUNTER — Emergency Department (HOSPITAL_BASED_OUTPATIENT_CLINIC_OR_DEPARTMENT_OTHER): Payer: Managed Care, Other (non HMO)

## 2021-07-28 ENCOUNTER — Encounter (HOSPITAL_BASED_OUTPATIENT_CLINIC_OR_DEPARTMENT_OTHER): Payer: Self-pay | Admitting: Emergency Medicine

## 2021-07-28 ENCOUNTER — Other Ambulatory Visit: Payer: Self-pay

## 2021-07-28 ENCOUNTER — Emergency Department (HOSPITAL_BASED_OUTPATIENT_CLINIC_OR_DEPARTMENT_OTHER)
Admission: EM | Admit: 2021-07-28 | Discharge: 2021-07-28 | Disposition: A | Discharge status: Home / Self Care | Payer: Managed Care, Other (non HMO) | Attending: Emergency Medicine | Admitting: Emergency Medicine

## 2021-07-28 DIAGNOSIS — M545 Low back pain, unspecified: Secondary | ICD-10-CM

## 2021-07-28 DIAGNOSIS — M5432 Sciatica, left side: Secondary | ICD-10-CM | POA: Insufficient documentation

## 2021-07-28 MED ORDER — KETOROLAC TROMETHAMINE 15 MG/ML IJ SOLN
15.0000 mg | Freq: Once | INTRAMUSCULAR | Status: AC
  Administered 2021-07-28: 11:00:00 15 mg via INTRAMUSCULAR
  Filled 2021-07-28: qty 1

## 2021-07-28 MED ORDER — LIDOCAINE 5 % EX PTCH
1.0000 | MEDICATED_PATCH | CUTANEOUS | Status: DC
  Administered 2021-07-28: 11:00:00 1 via TRANSDERMAL
  Filled 2021-07-28: qty 1

## 2021-07-28 MED ORDER — HYDROCODONE-ACETAMINOPHEN 5-325 MG PO TABS: 1.0000 | ORAL_TABLET | Freq: Four times a day (QID) | ORAL | 0 refills | Status: DC | PRN

## 2021-07-28 MED ORDER — HYDROCODONE-ACETAMINOPHEN 5-325 MG PO TABS
2.0000 | ORAL_TABLET | Freq: Once | ORAL | Status: AC
  Administered 2021-07-28: 12:00:00 2 via ORAL
  Filled 2021-07-28: qty 2

## 2021-07-28 MED ORDER — HYDROCODONE-ACETAMINOPHEN 10-325 MG PO TABS
1.0000 | ORAL_TABLET | Freq: Once | ORAL | Status: DC
  Filled 2021-07-28: qty 1

## 2021-07-28 MED ORDER — METHYLPREDNISOLONE SODIUM SUCC 125 MG IJ SOLR
125.0000 mg | Freq: Once | INTRAMUSCULAR | Status: AC
  Administered 2021-07-28: 11:00:00 125 mg via INTRAMUSCULAR
  Filled 2021-07-28: qty 2

## 2021-07-28 MED ORDER — HYDROCODONE-ACETAMINOPHEN 7.5-325 MG PO TABS: 1.0000 | ORAL_TABLET | Freq: Once | ORAL | Status: DC

## 2021-07-28 NOTE — ED Triage Notes (Signed)
Pt was getting out of his car yesterday and had a sudden  shooting pain to left side back and down leg. Pt is unable to change position with out help. Pt states pain is constant. Pt had back surgery to the same area few years ago (bulging disc), but this pain is wors.e

## 2021-07-28 NOTE — ED Provider Notes (Signed)
[facility] DE[facility] Provider Note   CSN: [id number] Arrival date & time: 07/28/21  7341     History Chief Complaint  Patient presents with   Back Pain    Troy Shelton is a 55 y.o. male.  Patient is a 55 yo male with pmh of L4-5 discectomy and laminotomypresenting for low back pain. Pt states he injured his back yesterday when getting out of his car and had immediate left sided lower back pain with pain radiating into the left leg. Denies sensation or motor deficits. Denies perineal paresthethesias. Denies bowel/urinary incontinence. Admits to difficulty walking due to pain. Denies falls. Denies spinal injections, IVDU, or fevers.  The history is provided by the patient. No language interpreter was used.  Back Pain Associated symptoms: no abdominal pain, no chest pain, no dysuria and no fever       History reviewed. No pertinent past medical history.  Patient Active Problem List   Diagnosis Date Noted   GERD 12/18/2007   BENIGN PROSTATIC HYPERTROPHY, HX OF 12/18/2007   HEPATITIS B, CHRONIC 10/30/2007   COLONIC POLYPS 10/30/2007   ANXIETY 10/30/2007   FATTY LIVER DISEASE 10/30/2007   HEPATITIS B 10/16/2007    History reviewed. No pertinent surgical history.     No family history on file.  Social History   Tobacco Use   Smoking status: Never    Passive exposure: Never   Smokeless tobacco: Never  Substance Use Topics   Alcohol use: Not Currently   Drug use: Never    Home Medications Prior to Admission medications   Not on File    Allergies    Patient has no allergy information on record.  Review of Systems   Review of Systems  Constitutional:  Negative for chills and fever.  HENT:  Negative for ear pain and sore throat.   Eyes:  Negative for pain and visual disturbance.  Respiratory:  Negative for cough and shortness of breath.   Cardiovascular:  Negative for chest pain and palpitations.  Gastrointestinal:  Negative for  abdominal pain and vomiting.  Genitourinary:  Negative for dysuria and hematuria.  Musculoskeletal:  Positive for back pain. Negative for arthralgias.  Skin:  Negative for color change and rash.  Neurological:  Negative for seizures and syncope.  All other systems reviewed and are negative.  Physical Exam Updated Vital Signs BP (!) 154/97 (BP Location: Right Arm)    Pulse 74    Temp 98.4 F (36.9 C) (Oral)    Resp (!) 21    Ht 6\' 1"  (1.854 m)    Wt 106.6 kg    SpO2 97%    BMI 31.00 kg/m   Physical Exam Vitals and nursing note reviewed.  Constitutional:      General: He is not in acute distress.    Appearance: He is well-developed.  HENT:     Head: Normocephalic and atraumatic.  Eyes:     Conjunctiva/sclera: Conjunctivae normal.  Cardiovascular:     Rate and Rhythm: Normal rate and regular rhythm.     Pulses:          Dorsalis pedis pulses are 2+ on the right side and 2+ on the left side.     Heart sounds: No murmur heard. Pulmonary:     Effort: Pulmonary effort is normal. No respiratory distress.     Breath sounds: Normal breath sounds.  Abdominal:     Palpations: Abdomen is soft.     Tenderness: There is no abdominal tenderness.  Musculoskeletal:        General: No swelling.     Cervical back: Neck supple.     Right lower leg: No edema.     Left lower leg: No edema.  Skin:    General: Skin is warm and dry.     Capillary Refill: Capillary refill takes less than 2 seconds.  Neurological:     Mental Status: He is alert.     GCS: GCS eye subscore is 4. GCS verbal subscore is 5. GCS motor subscore is 6.     Sensory: Sensation is intact.     Motor: Motor function is intact.  Psychiatric:        Mood and Affect: Mood normal.    ED Results / Procedures / Treatments   Labs (all labs ordered are listed, but only abnormal results are displayed) Labs Reviewed - No data to display  EKG None  Radiology No results found.  Procedures Procedures   Medications Ordered  in ED Medications  ketorolac (TORADOL) 15 MG/ML injection 15 mg (has no administration in time range)  methylPREDNISolone sodium succinate (SOLU-MEDROL) 125 mg/2 mL injection 125 mg (has no administration in time range)  HYDROcodone-acetaminophen (NORCO) 10-325 MG per tablet 1 tablet (has no administration in time range)  lidocaine (LIDODERM) 5 % 1 patch (has no administration in time range)    ED Course  I have reviewed the triage vital signs and the nursing notes.  Pertinent labs & imaging results that were available during my care of the patient were reviewed by me and considered in my medical decision making (see chart for details).    MDM Rules/Calculators/A&P                          10:27 AM 56 yo male with pmh of L4-5 discectomy and laminotomy presenting for low back pain with sciatica. On exam pt is Aox3, no acute distress, afebrile, with stable vitals. Physical exam demonstrates no neurological deficits. Difficulty with ambulation secondary to pain.   Toradol, steroid injection, Lidoderm pain patch, and Norco given for symptomatic relief. CT demonstrates: L4-5 left foraminal extrusion with marked L4 impingement. I spoke with on call neurosurgeon Dr. Izora Ribas who will follow up with patient tomorrow in office.   Patient in no distress and overall condition improved here in the ED. Prescription sent to pharmacy for pain control. Detailed discussions were had with the patient regarding current findings, and need for close f/u with PCP or on call doctor. The patient has been instructed to return immediately if the symptoms worsen in any way for re-evaluation. Patient verbalized understanding and is in agreement with current care plan. All questions answered prior to discharge.   Final Clinical Impression(s) / ED Diagnoses Final diagnoses:  Lumbar back pain  Sciatica of left side    Rx / DC Orders ED Discharge Orders     None        Lianne Cure, DO 99/35/70 1311

## 2021-07-28 NOTE — ED Notes (Signed)
Patient transported to X-ray 

## 2021-07-28 NOTE — ED Notes (Signed)
Pt wants to wait on hydrocodone. States he took some at home and did not help his pain.

## 2021-07-28 NOTE — ED Notes (Signed)
Patient transported to CT 

## 2021-07-28 NOTE — ED Notes (Signed)
Dc instructions reviewed with patient. Patient voiced understanding. Dc with belongings.  °

## 2021-07-28 NOTE — ED Notes (Signed)
Attempted to assist patient to sitting position. Pt not able to tolerate sitting on side of bed. Stood patient at the bedside and patient had extreme pain, and had to sit down and then lie back down on  his right side.

## 2021-07-30 ENCOUNTER — Other Ambulatory Visit: Payer: Self-pay | Admitting: Neurosurgery

## 2021-07-30 DIAGNOSIS — M5416 Radiculopathy, lumbar region: Secondary | ICD-10-CM

## 2021-07-30 DIAGNOSIS — R29898 Other symptoms and signs involving the musculoskeletal system: Secondary | ICD-10-CM

## 2021-07-31 ENCOUNTER — Ambulatory Visit
Admission: RE | Admit: 2021-07-31 | Discharge: 2021-07-31 | Disposition: A | Discharge status: Home / Self Care | Payer: Managed Care, Other (non HMO) | Source: Ambulatory Visit | Attending: Neurosurgery | Admitting: Neurosurgery

## 2021-07-31 ENCOUNTER — Other Ambulatory Visit: Payer: Self-pay

## 2021-07-31 DIAGNOSIS — M5416 Radiculopathy, lumbar region: Secondary | ICD-10-CM

## 2021-07-31 DIAGNOSIS — R29898 Other symptoms and signs involving the musculoskeletal system: Secondary | ICD-10-CM

## 2021-07-31 IMAGING — MR MR LUMBAR SPINE W/O CM
4 of 6 series · 25 of 48 positions shown · non-contrast
Comparison: CT lumbar spine [DATE]. Abdominopelvic CT
[DATE].

CLINICAL DATA: Low back pain radiating into the left leg for 5
days. No known injury. No previous relevant surgery.

EXAM:
MRI LUMBAR SPINE WITHOUT CONTRAST
TECHNIQUE: Multiplanar, multisequence MR imaging of the lumbar spine was
performed. No intravenous contrast was administered.

[Series 3: T2 · sagittal · 4.0mm · 0.61mm/px · 6 of 17 slices shown (1 of 2)]
[im 1/17]
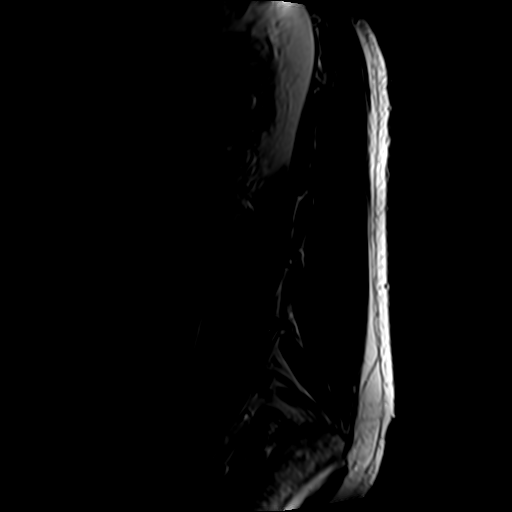
[im 4/17]
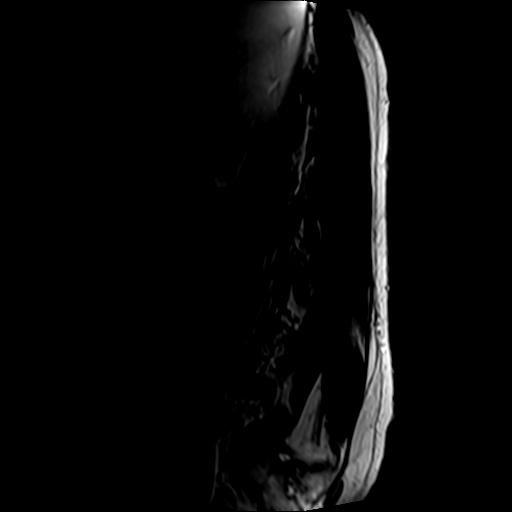
[im 7/17]
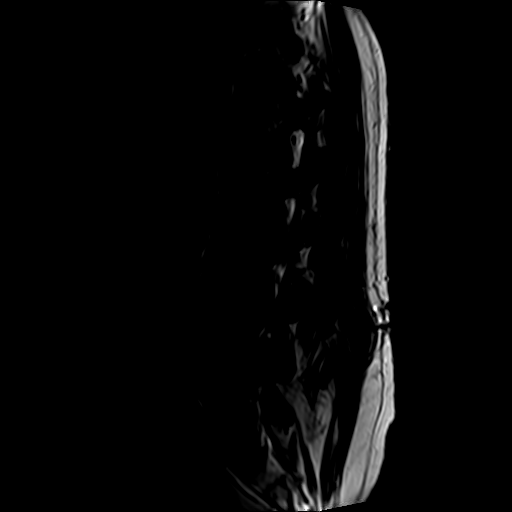
[im 10/17]
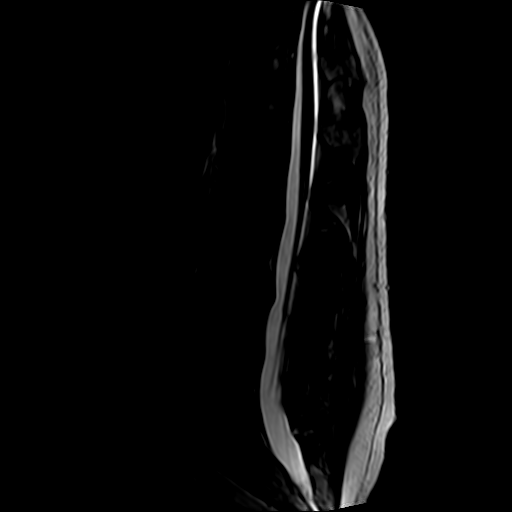
[im 13/17]
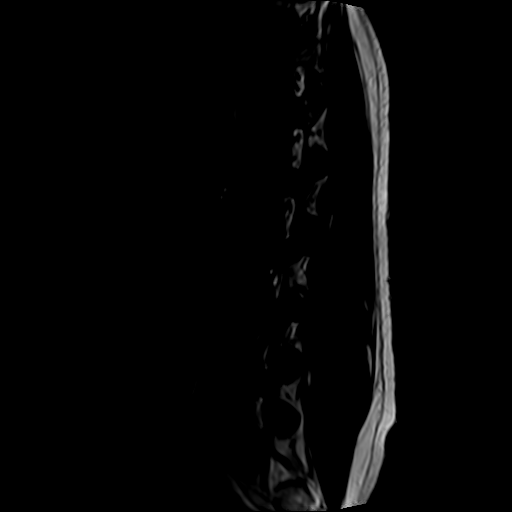
[im 17/17]
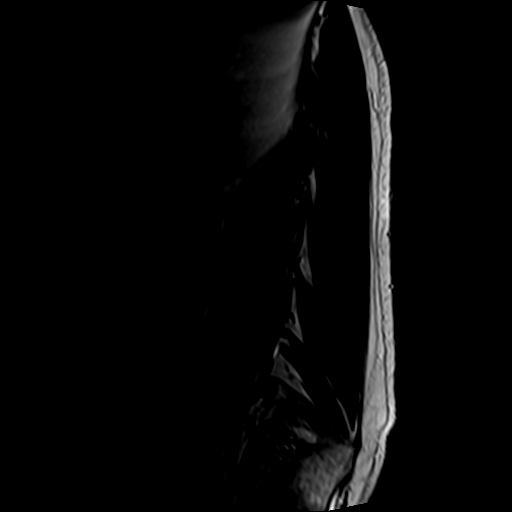

[Series 5: T1 · sagittal · 4.0mm · 0.53mm/px · 4 of 15 slices shown (1 of 2)]
[im 1/15]
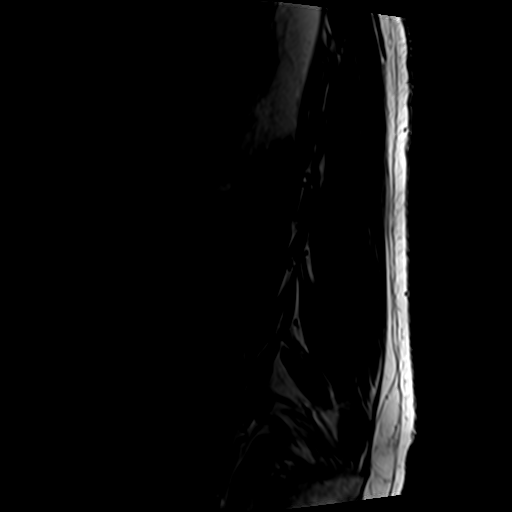
[im 5/15]
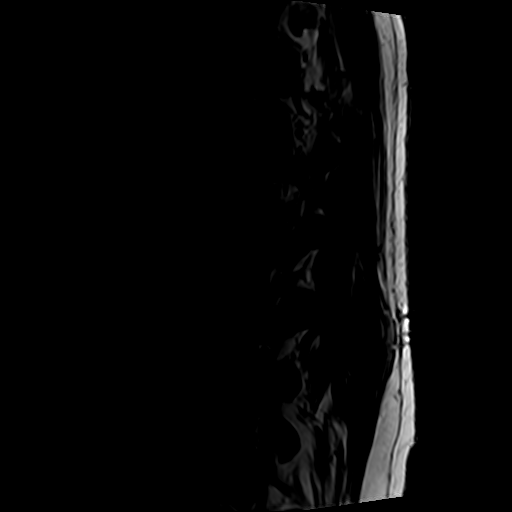
[im 10/15]
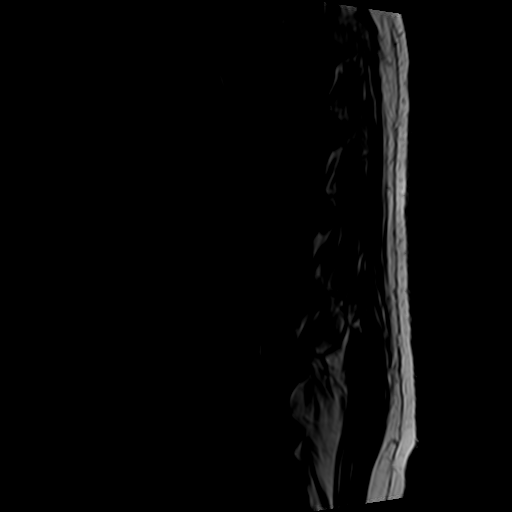
[im 15/15]
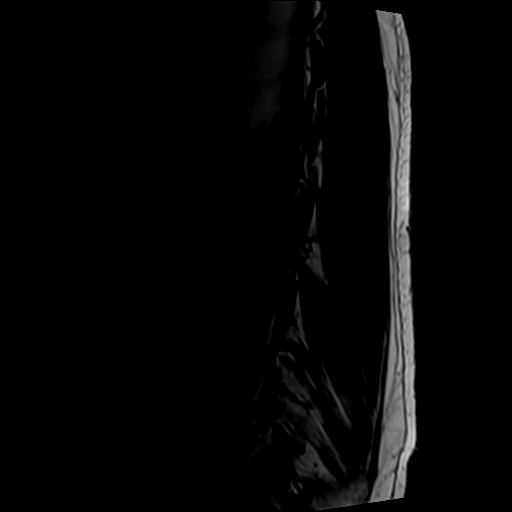

[Series 6: T2 · axial · 4.0mm · 0.78mm/px · z∈[-193,+60]mm · 8 of 48 slices shown (2 of 2)]
[im 1/48]
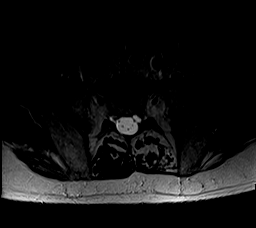
[im 8/48]
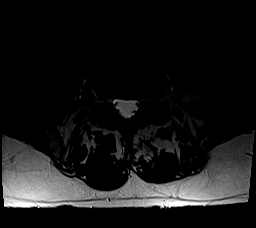
[im 15/48]
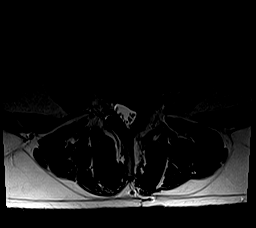
[im 22/48]
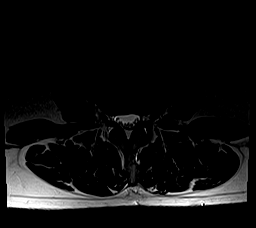
[im 26/48]
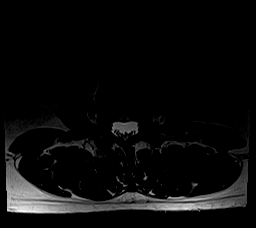
[im 33/48]
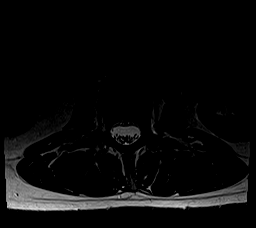
[im 40/48]
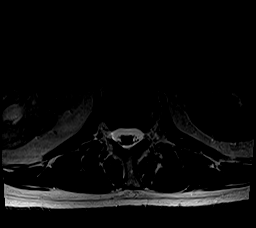
[im 48/48]
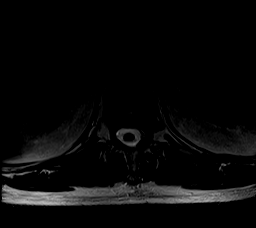

[Series 7: T1 · axial · 4.0mm · 0.39mm/px · z∈[-193,+19]mm · 7 of 48 slices shown (2 of 2)]
[im 1/48]
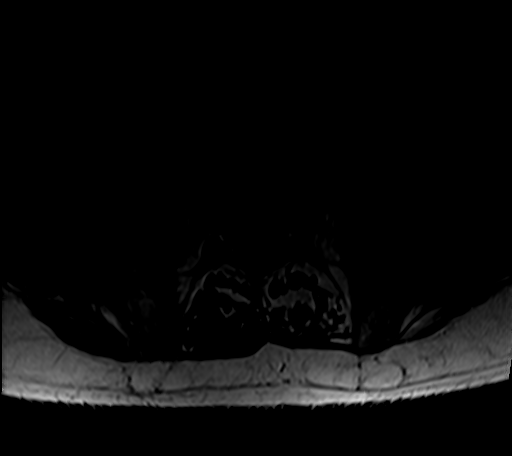
[im 8/48]
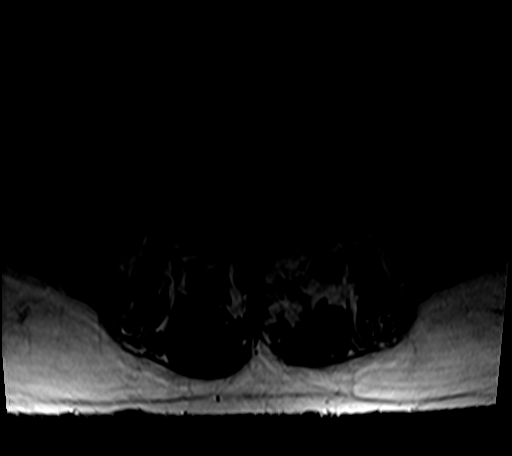
[im 15/48]
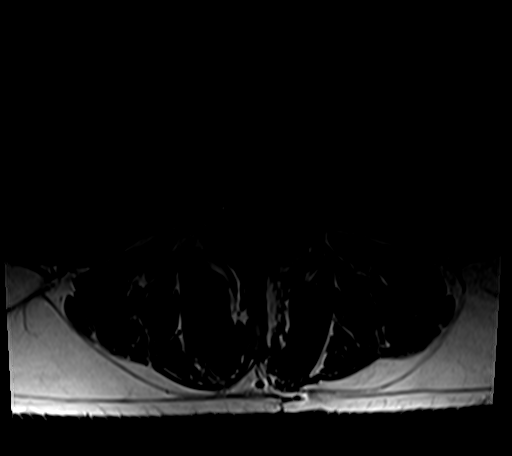
[im 22/48]
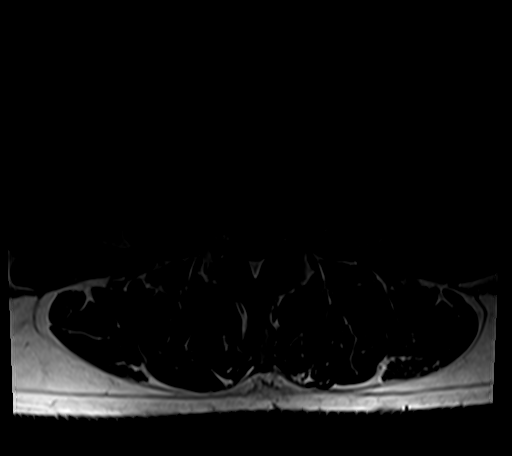
[im 26/48]
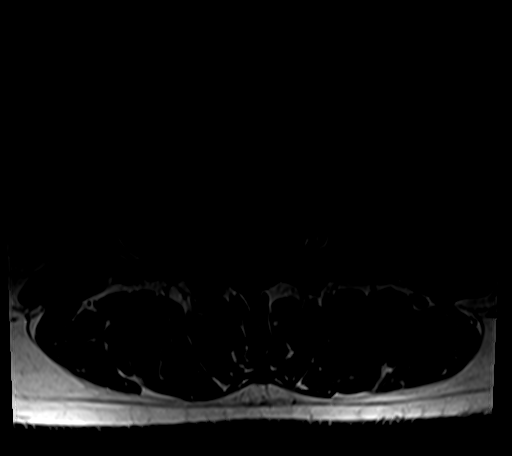
[im 33/48]
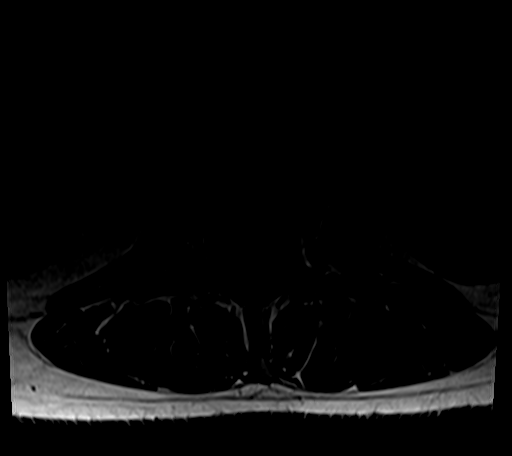
[im 40/48]
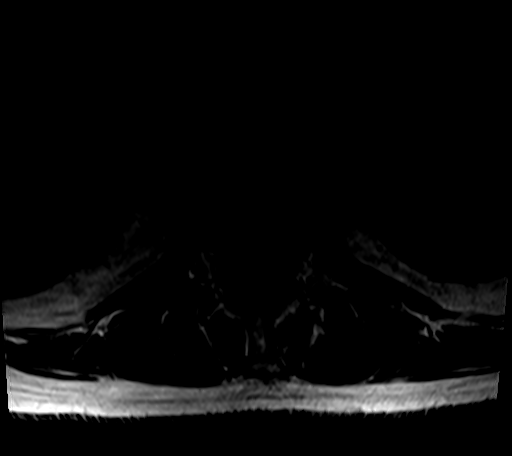

[25 of 48 positions shown; findings below may reference images not displayed]

FINDINGS: Segmentation: Conventional anatomy assumed, with the last open disc
space designated L5-S1.Concordant with previous imaging.

Alignment:  Straightening without focal angulation or listhesis.

Vertebrae: No worrisome osseous lesion, acute fracture or pars
defect. As suggested on CT, there is evidence of a left-sided
laminotomy at L4-5. The visualized sacroiliac joints appear
unremarkable.

Conus medullaris: Extends to the L2 level and appears normal.

Paraspinal and other soft tissues: No significant paraspinal
findings.

Disc levels:

T12-L1: Small left foraminal disc protrusion without nerve root
encroachment.

L1-2: Normal interspace.

L2-3: Preserved disc height with mild disc bulging and facet
hypertrophy. No spinal stenosis or nerve root encroachment.

L3-4: Preserved disc height with mild disc bulging eccentric to the
right. Mild facet and ligamentous hypertrophy. Mild narrowing of the
right lateral recess. The foramina are patent.

L4-5: Preserved disc height. As seen on CT, there is a broad-based
disc extrusion within the left subarticular zone and foramen. There
is mass effect on the thecal sac with probable encroachment on the
left L4 and L5 nerve roots. As above, evidence of previous
left-sided laminotomy at this level.

L5-S1: Small annular rent in the left subarticular zone without
significant disc protrusion, spinal stenosis or nerve root
encroachment. Mild bilateral facet hypertrophy.
IMPRESSION: 1. As seen on recent CT, large disc extrusion within the left L4-5
subarticular zone and foramen with associated left L4 and L5 nerve
root impingement. Apparent remote postsurgical changes at this
level.
2. No other significant spinal stenosis or nerve root encroachment.

## 2021-08-04 ENCOUNTER — Other Ambulatory Visit: Payer: Self-pay | Admitting: Neurosurgery

## 2021-08-04 DIAGNOSIS — Z01818 Encounter for other preprocedural examination: Secondary | ICD-10-CM

## 2021-08-06 ENCOUNTER — Encounter
Admission: RE | Admit: 2021-08-06 | Discharge: 2021-08-06 | Disposition: A | Discharge status: Home / Self Care | Payer: Managed Care, Other (non HMO) | Source: Ambulatory Visit | Attending: Neurosurgery | Admitting: Neurosurgery

## 2021-08-06 ENCOUNTER — Other Ambulatory Visit: Payer: Self-pay

## 2021-08-06 VITALS — BP 116/90 | HR 73 | Resp 18 | Ht 73.0 in | Wt 221.0 lb

## 2021-08-06 DIAGNOSIS — B1910 Unspecified viral hepatitis B without hepatic coma: Secondary | ICD-10-CM | POA: Insufficient documentation

## 2021-08-06 DIAGNOSIS — K7460 Unspecified cirrhosis of liver: Secondary | ICD-10-CM | POA: Diagnosis not present

## 2021-08-06 DIAGNOSIS — Z8619 Personal history of other infectious and parasitic diseases: Secondary | ICD-10-CM

## 2021-08-06 DIAGNOSIS — Z01818 Encounter for other preprocedural examination: Secondary | ICD-10-CM | POA: Diagnosis present

## 2021-08-06 HISTORY — DX: Benign prostatic hyperplasia without lower urinary tract symptoms: N40.0

## 2021-08-06 HISTORY — DX: Inflammatory liver disease, unspecified: K75.9

## 2021-08-06 HISTORY — DX: Gastro-esophageal reflux disease without esophagitis: K21.9

## 2021-08-06 HISTORY — DX: Anxiety disorder, unspecified: F41.9

## 2021-08-06 LAB — CBC
HCT: 49.4 % (ref 39.0–52.0)
Hemoglobin: 17.4 g/dL — ABNORMAL HIGH (ref 13.0–17.0)
MCH: 30.2 pg (ref 26.0–34.0)
MCHC: 35.2 g/dL (ref 30.0–36.0)
MCV: 85.8 fL (ref 80.0–100.0)
Platelets: 164 10*3/uL (ref 150–400)
RBC: 5.76 MIL/uL (ref 4.22–5.81)
RDW: 12.5 % (ref 11.5–15.5)
WBC: 8.8 10*3/uL (ref 4.0–10.5)
nRBC: 0 % (ref 0.0–0.2)

## 2021-08-06 LAB — COMPREHENSIVE METABOLIC PANEL
ALT: 67 U/L — ABNORMAL HIGH (ref 0–44)
AST: 39 U/L (ref 15–41)
Albumin: 4.3 g/dL (ref 3.5–5.0)
Alkaline Phosphatase: 58 U/L (ref 38–126)
Anion gap: 9 (ref 5–15)
BUN: 22 mg/dL — ABNORMAL HIGH (ref 6–20)
CO2: 23 mmol/L (ref 22–32)
Calcium: 9.1 mg/dL (ref 8.9–10.3)
Chloride: 105 mmol/L (ref 98–111)
Creatinine, Ser: 0.75 mg/dL (ref 0.61–1.24)
GFR, Estimated: >60 mL/min (ref >60)
Glucose, Bld: 159 mg/dL — ABNORMAL HIGH (ref 70–99)
Potassium: 3.9 mmol/L (ref 3.5–5.1)
Sodium: 137 mmol/L (ref 135–145)
Total Bilirubin: 1.7 mg/dL — ABNORMAL HIGH (ref 0.3–1.2)
Total Protein: 7.2 g/dL (ref 6.5–8.1)

## 2021-08-06 LAB — GLUCOSE, CAPILLARY: Glucose-Capillary: 137 mg/dL — ABNORMAL HIGH (ref 70–99)

## 2021-08-06 LAB — URINALYSIS, ROUTINE W REFLEX MICROSCOPIC
Bilirubin Urine: NEGATIVE
Glucose, UA: NEGATIVE mg/dL
Hgb urine dipstick: NEGATIVE
Ketones, ur: NEGATIVE mg/dL
Leukocytes,Ua: NEGATIVE
Nitrite: NEGATIVE
Protein, ur: NEGATIVE mg/dL
Specific Gravity, Urine: 1.025 (ref 1.005–1.030)
pH: 5 (ref 5.0–8.0)

## 2021-08-06 LAB — SURGICAL PCR SCREEN
MRSA, PCR: NEGATIVE
Staphylococcus aureus: NEGATIVE

## 2021-08-06 LAB — TYPE AND SCREEN
ABO/RH(D): AB POS
Antibody Screen: NEGATIVE

## 2021-08-06 LAB — APTT: aPTT: 26 seconds (ref 24–36)

## 2021-08-06 LAB — PROTIME-INR
INR: 1 (ref 0.8–1.2)
Prothrombin Time: 13.1 seconds (ref 11.4–15.2)

## 2021-08-06 NOTE — Patient Instructions (Addendum)
Your procedure is scheduled on: 08/10/21 Report to [facility]. To find out your arrival time please call [phone] between 1PM - 3PM on 08/07/21.  Remember: Instructions that are not followed completely may result in serious medical risk, up to and including death, or upon the discretion of your surgeon and anesthesiologist your surgery may need to be rescheduled.     _X__ 1. Do not eat food after midnight the night before your procedure.                 No gum chewing or hard candies. You may drink clear liquids up to 2 hours                 before you are scheduled to arrive for your surgery- DO not drink clear                 liquids within 2 hours of the start of your surgery.                 Clear Liquids include:  water, apple juice without pulp, clear carbohydrate                 drink such as Clearfast or Gatorade, Black Coffee or Tea (Do not add                 anything to coffee or tea). Diabetics water only  __X__2.  On the morning of surgery brush your teeth with toothpaste and water, you                 may rinse your mouth with mouthwash if you wish.  Do not swallow any              toothpaste of mouthwash.     _X__ 3.  No Alcohol for 24 hours before or after surgery.   _X__ 4.  Do Not Smoke or use e-cigarettes For 24 Hours Prior to Your Surgery.                 Do not use any chewable tobacco products for at least 6 hours prior to                 surgery.  ____  5.  Bring all medications with you on the day of surgery if instructed.   __X__  6.  Notify your doctor if there is any change in your medical condition      (cold, fever, infections).     Do not wear jewelry, make-up, hairpins, clips or nail polish. Do not wear lotions, powders, or perfumes.  Do not shave body hair 48 hours prior to surgery. Men may shave face and neck. Do not bring valuables to the hospital.    [facility] is not responsible for any  belongings or valuables.  Contacts, dentures/partials or body piercings may not be worn into surgery. Bring a case for your contacts, glasses or hearing aids, a denture cup will be supplied. Leave your suitcase in the car. After surgery it may be brought to your room. For patients admitted to the hospital, discharge time is determined by your treatment team.   Patients discharged the day of surgery will not be allowed to drive home.   Please read over the following fact sheets that you were given:   MRSA Information, CHG soap  __X__ Take these medicines the morning of surgery with A SIP  OF WATER:    1. pantoprazole (PROTONIX) 40 MG tablet  2. sertraline (ZOLOFT) 100 MG tablet  3. tamsulosin (FLOMAX) 0.4 MG CAPS capsule  4. VEMLIDY 25 MG TABS  5.  6.  ____ Fleet Enema (as directed)   __X__ Use CHG Soap/SAGE wipes as directed  ____ Use inhalers on the day of surgery  ____ Stop metformin/Janumet/Farxiga 2 days prior to surgery    ____ Take 1/2 of usual insulin dose the night before surgery. No insulin the morning          of surgery.   ____ Stop Blood Thinners Coumadin/Plavix/Xarelto/Pleta/Pradaxa/Eliquis/Effient/Aspirin  on   Or contact your Surgeon, Cardiologist or Medical Doctor regarding  ability to stop your blood thinners  __X__ Stop Anti-inflammatories 7 days before surgery such as Advil, Ibuprofen, Motrin,  BC or Goodies Powder, Naprosyn, Naproxen, Aleve, Aspirin   May take Tylenol  __X__ Stop all herbals and supplements, fish oil or vitamins  until after surgery.    ____ Bring C-Pap to the hospital.

## 2021-08-06 NOTE — Pre-Procedure Instructions (Signed)
Patient was sitting up in lab chair having blood drawn looked over at site and became diaphoretic, pale stated he felt as though he was going to pass out.Patient laid head on arm rest. BP 95/67, P 78, O2 95%. Patient never lost consciousness.He was assisted to recliner connected to EKG machine NSR FSBS 130's given cola and cracker. Wife brought to side stated he sometimes will do this especially when he hasn't eaten all day. Patient color returned to normal skin dry. He left ambulatory with wife at his own  discretion.

## 2021-08-10 ENCOUNTER — Ambulatory Visit
Admission: RE | Admit: 2021-08-10 | Discharge: 2021-08-10 | Disposition: A | Discharge status: Home / Self Care | Payer: Managed Care, Other (non HMO) | Source: Ambulatory Visit | Attending: Neurosurgery | Admitting: Neurosurgery

## 2021-08-10 ENCOUNTER — Ambulatory Visit: Payer: Managed Care, Other (non HMO)

## 2021-08-10 ENCOUNTER — Encounter: Payer: Self-pay | Admitting: Neurosurgery

## 2021-08-10 ENCOUNTER — Ambulatory Visit: Payer: Managed Care, Other (non HMO) | Admitting: Certified Registered Nurse Anesthetist

## 2021-08-10 ENCOUNTER — Other Ambulatory Visit: Payer: Self-pay

## 2021-08-10 ENCOUNTER — Encounter
Admission: RE | Disposition: A | Discharge status: Home / Self Care | Payer: Self-pay | Source: Ambulatory Visit | Attending: Neurosurgery

## 2021-08-10 DIAGNOSIS — M5116 Intervertebral disc disorders with radiculopathy, lumbar region: Secondary | ICD-10-CM | POA: Diagnosis not present

## 2021-08-10 DIAGNOSIS — Z01818 Encounter for other preprocedural examination: Secondary | ICD-10-CM

## 2021-08-10 DIAGNOSIS — M5416 Radiculopathy, lumbar region: Secondary | ICD-10-CM

## 2021-08-10 DIAGNOSIS — R29898 Other symptoms and signs involving the musculoskeletal system: Secondary | ICD-10-CM

## 2021-08-10 HISTORY — PX: LUMBAR DISC SURGERY: SHX700

## 2021-08-10 IMAGING — RF DG LUMBAR SPINE 2-3V
1 series · 3 of 3 positions shown · non-contrast
Comparison: MRI [DATE].

CLINICAL DATA: L4-L5 discectomy.

EXAM:
LUMBAR SPINE - 2-3 VIEW

[Series 1: dg x-ray · 0.20mm/px · 3 of 3 slices shown]
[im 1/3]
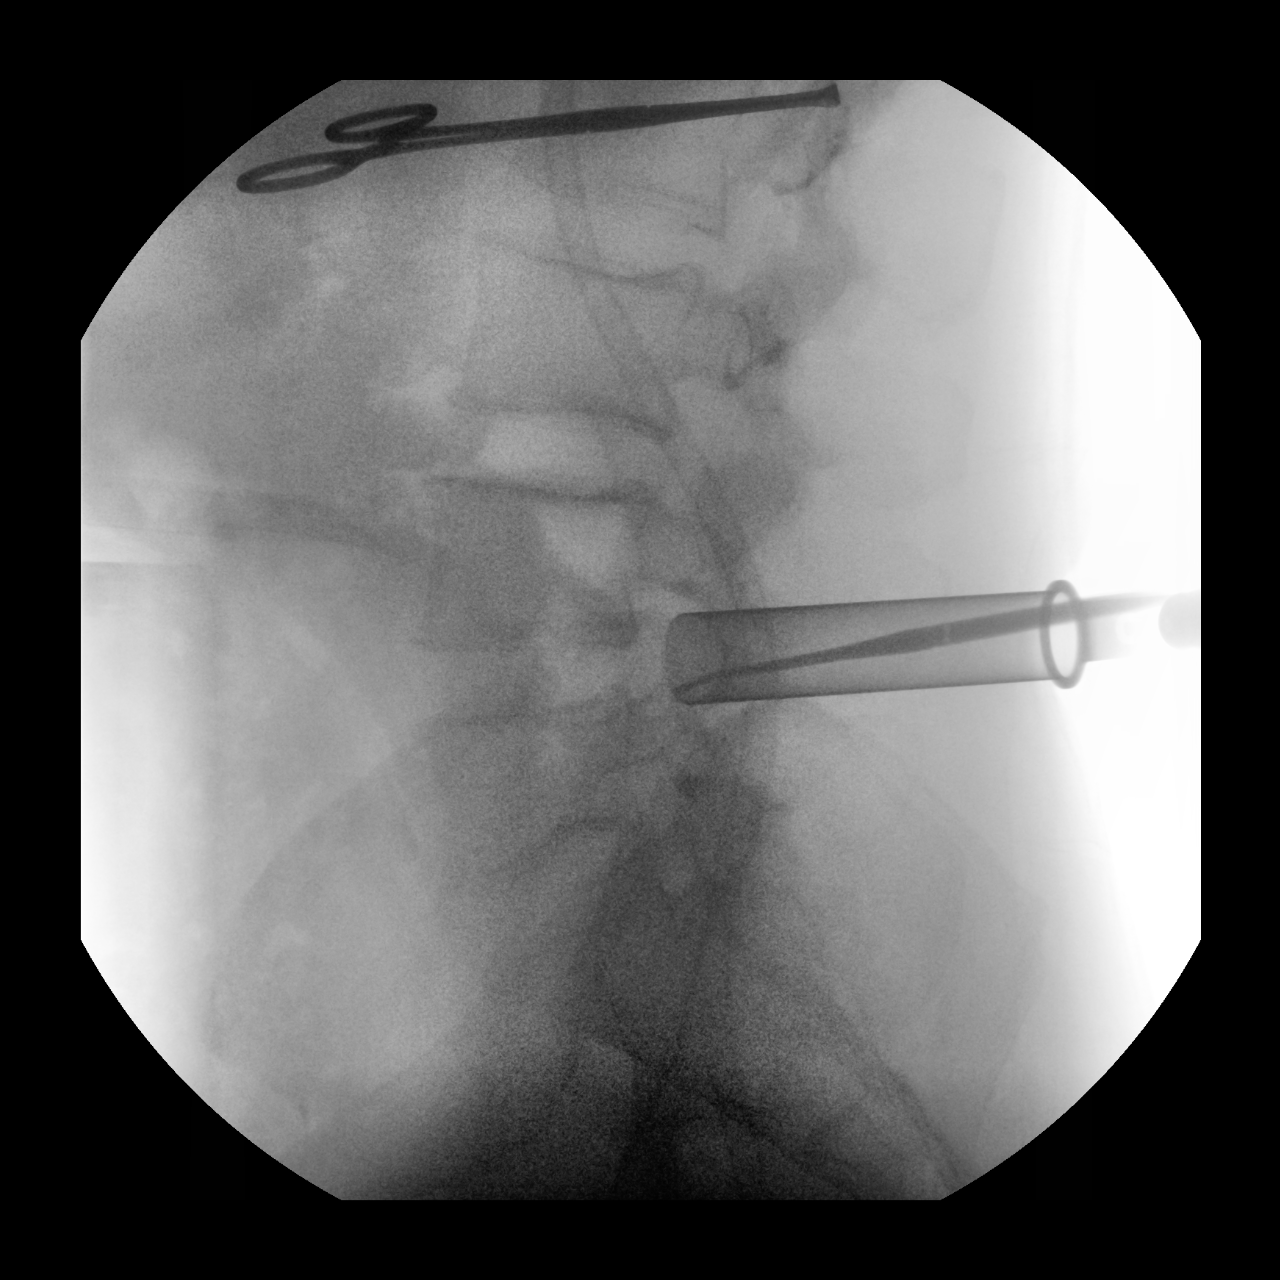
[im 2/3]
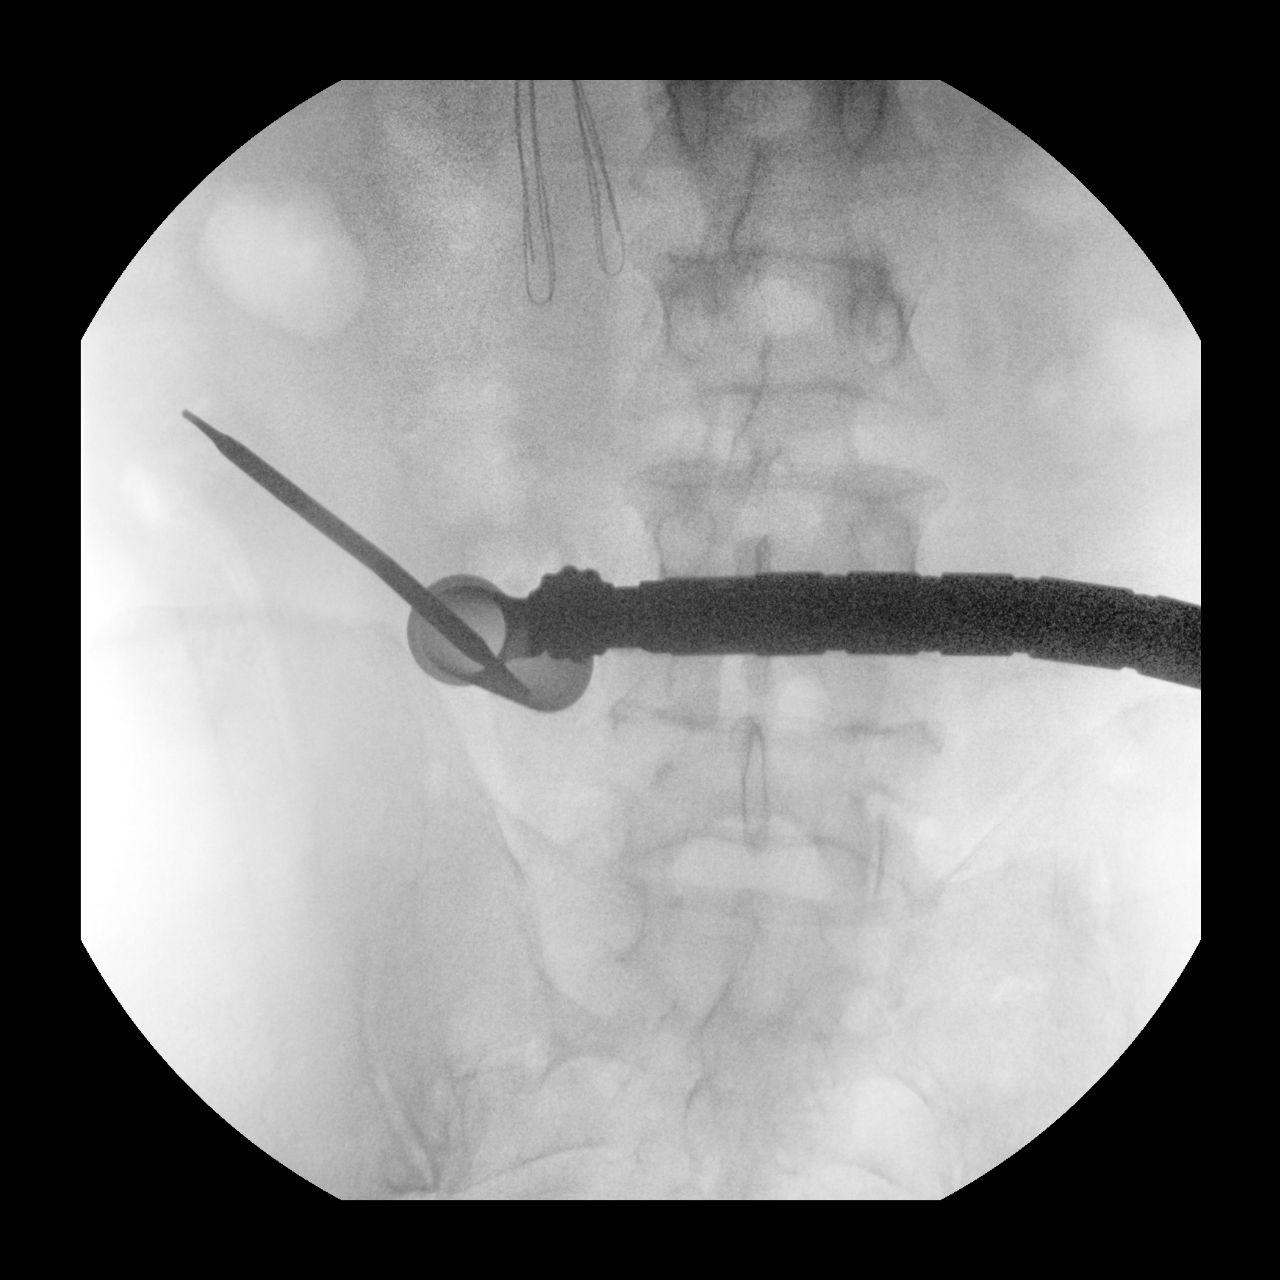
[im 3/3]
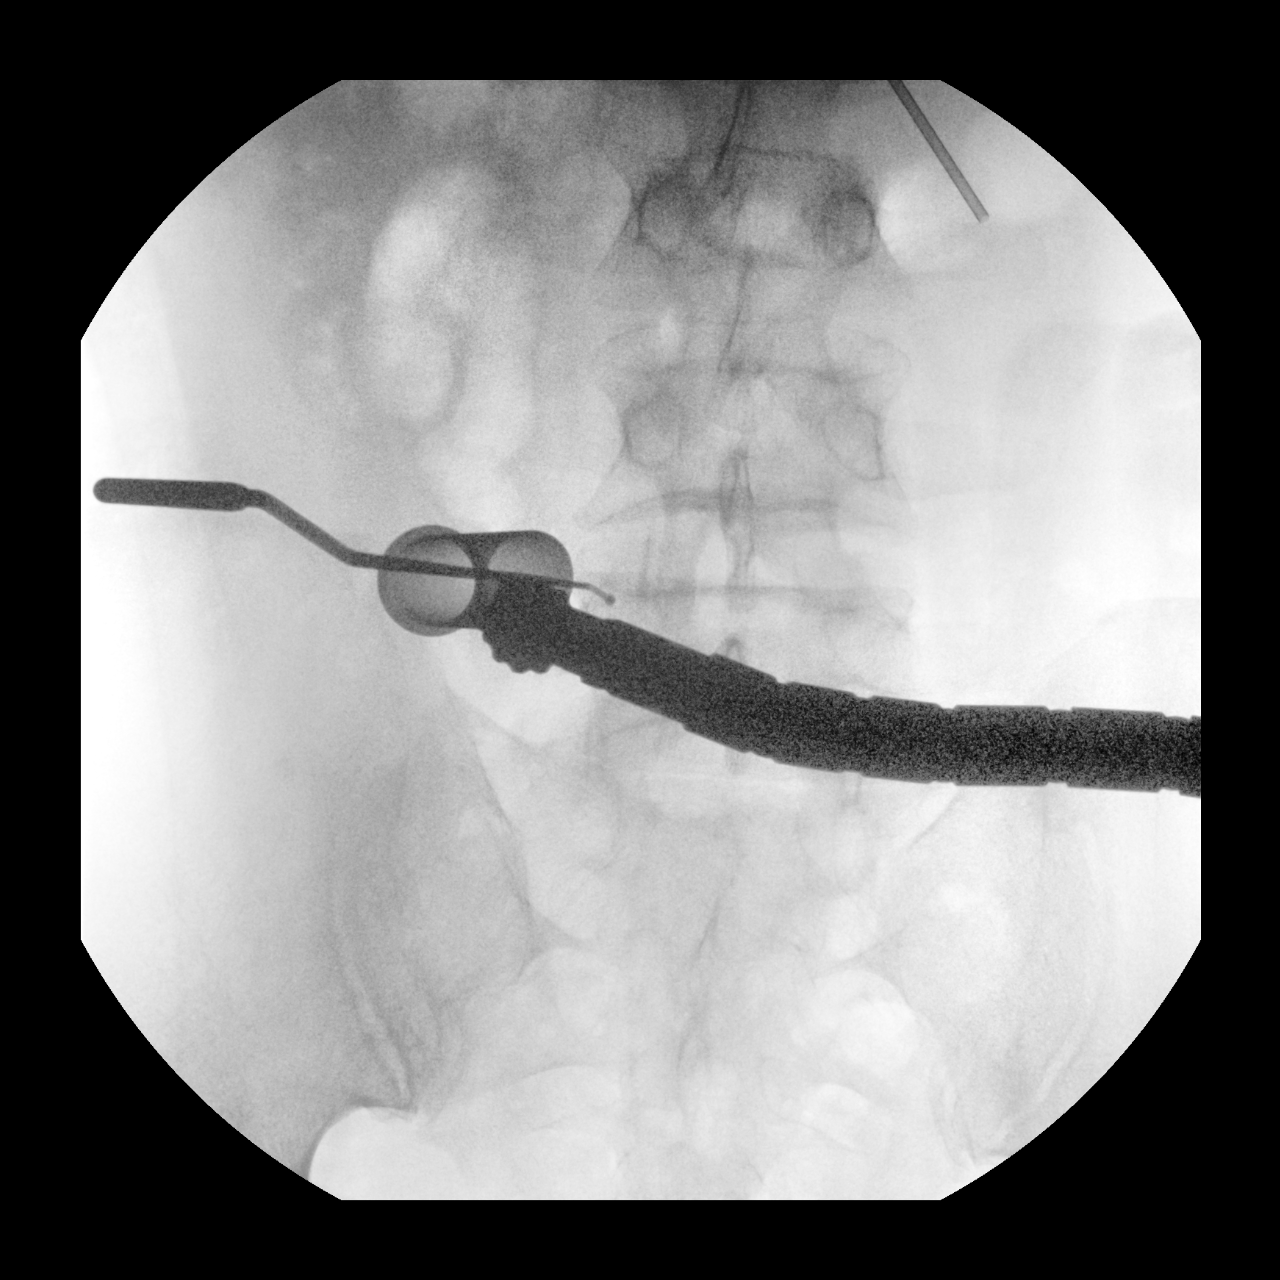

[3 of 3 positions shown; findings below may reference images not displayed]

FINDINGS: Fluoro time: 11 seconds.

Reported radiation dose: 4.94 mGy.

Four C-arm fluoroscopic images were obtained intraoperatively and
submitted for post operative interpretation. These images
demonstrate surgical devices projecting at the left L4-L5 disc space
posteriorly. Please see the performing provider's procedural report
for further detail.
IMPRESSION: Intraoperative fluoroscopy, as detailed above.

## 2021-08-10 SURGERY — FAR LATERAL DECOMPRESSION 1 LEVEL
Anesthesia: General | Laterality: Left

## 2021-08-10 MED ORDER — SUCCINYLCHOLINE CHLORIDE 200 MG/10ML IV SOSY
PREFILLED_SYRINGE | INTRAVENOUS | Status: DC | PRN
  Administered 2021-08-10: 120 mg via INTRAVENOUS

## 2021-08-10 MED ORDER — HYDROMORPHONE HCL 1 MG/ML IJ SOLN
INTRAMUSCULAR | Status: AC
  Filled 2021-08-10: qty 1

## 2021-08-10 MED ORDER — SURGIFLO WITH THROMBIN (HEMOSTATIC MATRIX KIT) OPTIME
TOPICAL | Status: DC | PRN
  Administered 2021-08-10: 1 via TOPICAL

## 2021-08-10 MED ORDER — ONDANSETRON HCL 4 MG/2ML IJ SOLN: 4.0000 mg | Freq: Once | INTRAMUSCULAR | Status: DC | PRN

## 2021-08-10 MED ORDER — FENTANYL CITRATE (PF) 100 MCG/2ML IJ SOLN
25.0000 ug | INTRAMUSCULAR | Status: DC | PRN
  Administered 2021-08-10 (×2): 50 ug via INTRAVENOUS

## 2021-08-10 MED ORDER — ORAL CARE MOUTH RINSE: 15.0000 mL | Freq: Once | OROMUCOSAL | Status: AC

## 2021-08-10 MED ORDER — BUPIVACAINE-EPINEPHRINE (PF) 0.5% -1:200000 IJ SOLN
INTRAMUSCULAR | Status: DC | PRN
  Administered 2021-08-10: 4 mL via PERINEURAL

## 2021-08-10 MED ORDER — ACETAMINOPHEN 10 MG/ML IV SOLN: 1000.0000 mg | Freq: Once | INTRAVENOUS | Status: DC | PRN

## 2021-08-10 MED ORDER — OXYCODONE HCL 5 MG/5ML PO SOLN: 5.0000 mg | Freq: Once | ORAL | Status: AC | PRN

## 2021-08-10 MED ORDER — METHOCARBAMOL 500 MG PO TABS: 500.0000 mg | ORAL_TABLET | Freq: Four times a day (QID) | ORAL | 0 refills | Status: DC

## 2021-08-10 MED ORDER — LACTATED RINGERS IV SOLN: INTRAVENOUS | Status: DC | PRN

## 2021-08-10 MED ORDER — ROCURONIUM BROMIDE 10 MG/ML (PF) SYRINGE
PREFILLED_SYRINGE | INTRAVENOUS | Status: AC
  Filled 2021-08-10: qty 10

## 2021-08-10 MED ORDER — MIDAZOLAM HCL 2 MG/2ML IJ SOLN
INTRAMUSCULAR | Status: DC | PRN
  Administered 2021-08-10: 1 mg via INTRAVENOUS

## 2021-08-10 MED ORDER — BUPIVACAINE-EPINEPHRINE (PF) 0.5% -1:200000 IJ SOLN
INTRAMUSCULAR | Status: AC
  Filled 2021-08-10: qty 30

## 2021-08-10 MED ORDER — HYDROMORPHONE HCL 1 MG/ML IJ SOLN
INTRAMUSCULAR | Status: DC | PRN
  Administered 2021-08-10: 1 mg via INTRAVENOUS

## 2021-08-10 MED ORDER — PHENYLEPHRINE HCL (PRESSORS) 10 MG/ML IV SOLN
INTRAVENOUS | Status: DC | PRN
  Administered 2021-08-10 (×4): 80 ug via INTRAVENOUS

## 2021-08-10 MED ORDER — CEFAZOLIN SODIUM-DEXTROSE 2-3 GM-%(50ML) IV SOLR
INTRAVENOUS | Status: DC | PRN
Start: 2021-08-10 — End: 2021-08-10
  Administered 2021-08-10: 2 g via INTRAVENOUS

## 2021-08-10 MED ORDER — SODIUM CHLORIDE FLUSH 0.9 % IV SOLN
INTRAVENOUS | Status: AC
  Filled 2021-08-10: qty 20

## 2021-08-10 MED ORDER — FENTANYL CITRATE (PF) 100 MCG/2ML IJ SOLN
INTRAMUSCULAR | Status: DC | PRN
  Administered 2021-08-10: 100 ug via INTRAVENOUS

## 2021-08-10 MED ORDER — EPHEDRINE SULFATE 50 MG/ML IJ SOLN
INTRAMUSCULAR | Status: DC | PRN
  Administered 2021-08-10: 10 mg via INTRAVENOUS
  Administered 2021-08-10: 5 mg via INTRAVENOUS

## 2021-08-10 MED ORDER — ONDANSETRON HCL 4 MG/2ML IJ SOLN
INTRAMUSCULAR | Status: DC | PRN
  Administered 2021-08-10: 4 mg via INTRAVENOUS

## 2021-08-10 MED ORDER — PHENYLEPHRINE HCL-NACL 20-0.9 MG/250ML-% IV SOLN
INTRAVENOUS | Status: AC
  Filled 2021-08-10: qty 250

## 2021-08-10 MED ORDER — BUPIVACAINE HCL (PF) 0.5 % IJ SOLN
INTRAMUSCULAR | Status: AC
  Filled 2021-08-10: qty 30

## 2021-08-10 MED ORDER — MIDAZOLAM HCL 2 MG/2ML IJ SOLN
INTRAMUSCULAR | Status: AC
  Filled 2021-08-10: qty 2

## 2021-08-10 MED ORDER — LIDOCAINE HCL (CARDIAC) PF 100 MG/5ML IV SOSY
PREFILLED_SYRINGE | INTRAVENOUS | Status: DC | PRN
  Administered 2021-08-10: 100 mg via INTRAVENOUS

## 2021-08-10 MED ORDER — PROPOFOL 10 MG/ML IV BOLUS
INTRAVENOUS | Status: AC
  Filled 2021-08-10: qty 40

## 2021-08-10 MED ORDER — LACTATED RINGERS IV SOLN: INTRAVENOUS | Status: DC

## 2021-08-10 MED ORDER — ACETAMINOPHEN 10 MG/ML IV SOLN
INTRAVENOUS | Status: DC | PRN
  Administered 2021-08-10: 1000 mg via INTRAVENOUS

## 2021-08-10 MED ORDER — CHLORHEXIDINE GLUCONATE 0.12 % MT SOLN
15.0000 mL | Freq: Once | OROMUCOSAL | Status: AC
  Administered 2021-08-10: 15 mL via OROMUCOSAL

## 2021-08-10 MED ORDER — ACETAMINOPHEN 10 MG/ML IV SOLN
INTRAVENOUS | Status: AC
  Filled 2021-08-10: qty 100

## 2021-08-10 MED ORDER — GLYCOPYRROLATE 0.2 MG/ML IJ SOLN
INTRAMUSCULAR | Status: AC
  Filled 2021-08-10: qty 2

## 2021-08-10 MED ORDER — METHOCARBAMOL 500 MG PO TABS: 500.0000 mg | ORAL_TABLET | Freq: Four times a day (QID) | ORAL | Status: DC

## 2021-08-10 MED ORDER — PROPOFOL 10 MG/ML IV BOLUS
INTRAVENOUS | Status: DC | PRN
  Administered 2021-08-10: 180 mg via INTRAVENOUS

## 2021-08-10 MED ORDER — LIDOCAINE HCL (PF) 2 % IJ SOLN
INTRAMUSCULAR | Status: AC
  Filled 2021-08-10: qty 15

## 2021-08-10 MED ORDER — DEXAMETHASONE SODIUM PHOSPHATE 10 MG/ML IJ SOLN
INTRAMUSCULAR | Status: AC
  Filled 2021-08-10: qty 2

## 2021-08-10 MED ORDER — DEXAMETHASONE SODIUM PHOSPHATE 10 MG/ML IJ SOLN
INTRAMUSCULAR | Status: DC | PRN
  Administered 2021-08-10: 10 mg via INTRAVENOUS

## 2021-08-10 MED ORDER — 0.9 % SODIUM CHLORIDE (POUR BTL) OPTIME
TOPICAL | Status: DC | PRN
  Administered 2021-08-10: 1000 mL

## 2021-08-10 MED ORDER — BUPIVACAINE LIPOSOME 1.3 % IJ SUSP
INTRAMUSCULAR | Status: AC
  Filled 2021-08-10: qty 20

## 2021-08-10 MED ORDER — OXYCODONE-ACETAMINOPHEN 7.5-325 MG PO TABS: 1.0000 | ORAL_TABLET | Freq: Four times a day (QID) | ORAL | 0 refills | Status: AC | PRN

## 2021-08-10 MED ORDER — FENTANYL CITRATE (PF) 100 MCG/2ML IJ SOLN
INTRAMUSCULAR | Status: AC
  Filled 2021-08-10: qty 2

## 2021-08-10 MED ORDER — SODIUM CHLORIDE (PF) 0.9 % IJ SOLN
INTRAMUSCULAR | Status: DC | PRN
  Administered 2021-08-10: 60 mL

## 2021-08-10 MED ORDER — ONDANSETRON HCL 4 MG/2ML IJ SOLN
INTRAMUSCULAR | Status: AC
  Filled 2021-08-10: qty 6

## 2021-08-10 MED ORDER — GLYCOPYRROLATE 0.2 MG/ML IJ SOLN
INTRAMUSCULAR | Status: DC | PRN
  Administered 2021-08-10: .2 mg via INTRAVENOUS

## 2021-08-10 MED ORDER — CEFAZOLIN SODIUM-DEXTROSE 2-4 GM/100ML-% IV SOLN
INTRAVENOUS | Status: AC
  Filled 2021-08-10: qty 100

## 2021-08-10 MED ORDER — OXYCODONE HCL 5 MG PO TABS
ORAL_TABLET | ORAL | Status: AC
  Filled 2021-08-10: qty 1

## 2021-08-10 MED ORDER — METHOCARBAMOL 500 MG PO TABS
ORAL_TABLET | ORAL | Status: AC
  Administered 2021-08-10: 500 mg
  Filled 2021-08-10: qty 1

## 2021-08-10 MED ORDER — OXYCODONE HCL 5 MG PO TABS
5.0000 mg | ORAL_TABLET | Freq: Once | ORAL | Status: AC | PRN
  Administered 2021-08-10: 5 mg via ORAL

## 2021-08-10 MED ORDER — SENNA 8.6 MG PO TABS: 1.0000 | ORAL_TABLET | Freq: Every day | ORAL | 0 refills | Status: DC | PRN

## 2021-08-10 MED ORDER — CEFAZOLIN SODIUM-DEXTROSE 2-4 GM/100ML-% IV SOLN: 2.0000 g | Freq: Once | INTRAVENOUS | Status: DC

## 2021-08-10 MED ORDER — METHYLPREDNISOLONE ACETATE 40 MG/ML IJ SUSP
INTRAMUSCULAR | Status: AC
  Filled 2021-08-10: qty 1

## 2021-08-10 MED ORDER — METHYLPREDNISOLONE ACETATE 40 MG/ML IJ SUSP
INTRAMUSCULAR | Status: DC | PRN
  Administered 2021-08-10: 40 mg

## 2021-08-10 SURGICAL SUPPLY — 59 items
ADH SKN CLS APL DERMABOND .7 (GAUZE/BANDAGES/DRESSINGS) ×1
AGENT HMST KT MTR STRL THRMB (HEMOSTASIS) ×1
APL PRP STRL LF DISP 70% ISPRP (MISCELLANEOUS) ×2
BUR NEURO DRILL SOFT 3.0X3.8M (BURR) ×2 IMPLANT
CHLORAPREP W/TINT 26 (MISCELLANEOUS) ×4 IMPLANT
CNTNR SPEC 2.5X3XGRAD LEK (MISCELLANEOUS) ×1
CONT SPEC 4OZ STER OR WHT (MISCELLANEOUS) ×1
CONT SPEC 4OZ STRL OR WHT (MISCELLANEOUS) ×1
CONTAINER SPEC 2.5X3XGRAD LEK (MISCELLANEOUS) ×1 IMPLANT
COUNTER NEEDLE 20/40 LG (NEEDLE) ×2 IMPLANT
CUP MEDICINE 2OZ PLAST GRAD ST (MISCELLANEOUS) ×4 IMPLANT
DERMABOND ADVANCED (GAUZE/BANDAGES/DRESSINGS) ×1
DERMABOND ADVANCED .7 DNX12 (GAUZE/BANDAGES/DRESSINGS) ×1 IMPLANT
DRAPE C ARM PK CFD 31 SPINE (DRAPES) ×2 IMPLANT
DRAPE C-ARM 42X72 X-RAY (DRAPES) ×1 IMPLANT
DRAPE LAPAROTOMY 100X77 ABD (DRAPES) ×2 IMPLANT
DRAPE MICROSCOPE SPINE 48X150 (DRAPES) ×2 IMPLANT
DRAPE SURG 17X11 SM STRL (DRAPES) ×2 IMPLANT
DRSG OPSITE POSTOP 3X4 (GAUZE/BANDAGES/DRESSINGS) ×1 IMPLANT
ELECT CAUTERY BLADE TIP 2.5 (TIP) ×2
ELECT EZSTD 165MM 6.5IN (MISCELLANEOUS)
ELECT REM PT RETURN 9FT ADLT (ELECTROSURGICAL) ×2
ELECTRODE CAUTERY BLDE TIP 2.5 (TIP) ×1 IMPLANT
ELECTRODE EZSTD 165MM 6.5IN (MISCELLANEOUS) IMPLANT
ELECTRODE REM PT RTRN 9FT ADLT (ELECTROSURGICAL) ×1 IMPLANT
GAUZE 4X4 16PLY ~~LOC~~+RFID DBL (SPONGE) ×2 IMPLANT
GLOVE SURG SYN 6.5 ES PF (GLOVE) ×4 IMPLANT
GLOVE SURG SYN 6.5 PF PI (GLOVE) ×2 IMPLANT
GLOVE SURG SYN 8.5  E (GLOVE) ×6
GLOVE SURG SYN 8.5 E (GLOVE) ×3 IMPLANT
GLOVE SURG SYN 8.5 PF PI (GLOVE) ×3 IMPLANT
GLOVE SURG UNDER POLY LF SZ6.5 (GLOVE) ×2 IMPLANT
GOWN SRG LRG LVL 4 IMPRV REINF (GOWNS) ×1 IMPLANT
GOWN SRG XL LVL 3 NONREINFORCE (GOWNS) ×1 IMPLANT
GOWN STRL NON-REIN TWL XL LVL3 (GOWNS) ×2
GOWN STRL REIN LRG LVL4 (GOWNS) ×2
GRADUATE 1200CC STRL 31836 (MISCELLANEOUS) ×2 IMPLANT
GRAFT DURAGEN MATRIX 1WX1L (Tissue) IMPLANT
KIT SPINAL PRONEVIEW (KITS) ×2 IMPLANT
MANIFOLD NEPTUNE II (INSTRUMENTS) ×2 IMPLANT
MARKER SKIN DUAL TIP RULER LAB (MISCELLANEOUS) ×3 IMPLANT
NDL SAFETY ECLIPSE 18X1.5 (NEEDLE) ×1 IMPLANT
NEEDLE HYPO 18GX1.5 SHARP (NEEDLE) ×2
NEEDLE HYPO 22GX1.5 SAFETY (NEEDLE) ×2 IMPLANT
NS IRRIG 1000ML POUR BTL (IV SOLUTION) ×2 IMPLANT
PACK LAMINECTOMY NEURO (CUSTOM PROCEDURE TRAY) ×2 IMPLANT
PAD ARMBOARD 7.5X6 YLW CONV (MISCELLANEOUS) ×2 IMPLANT
SURGIFLO W/THROMBIN 8M KIT (HEMOSTASIS) ×2 IMPLANT
SUT BONE WAX W31G (SUTURE) ×1 IMPLANT
SUT DVC VLOC 3-0 CL 6 P-12 (SUTURE) ×2 IMPLANT
SUT VIC AB 0 CT1 27 (SUTURE) ×2
SUT VIC AB 0 CT1 27XCR 8 STRN (SUTURE) ×1 IMPLANT
SUT VIC AB 2-0 CT1 18 (SUTURE) ×2 IMPLANT
SYR 10ML LL (SYRINGE) ×2 IMPLANT
SYR 20ML LL LF (SYRINGE) ×2 IMPLANT
SYR 30ML LL (SYRINGE) ×4 IMPLANT
SYR 3ML LL SCALE MARK (SYRINGE) ×2 IMPLANT
TOWEL OR 17X26 4PK STRL BLUE (TOWEL DISPOSABLE) ×6 IMPLANT
TUBING CONNECTING 10 (TUBING) ×2 IMPLANT

## 2021-08-10 NOTE — Progress Notes (Signed)
Paged Dr. Izora Ribas to verify patient discharge orders. Patient needs to be able to urinate, ambulate, and intake po's before being discharged. Per Dr. Izora Ribas patient does not have to stay 4 hours in post op.

## 2021-08-10 NOTE — Progress Notes (Signed)
Patient is doing well in recover. Able to wiggle toes, bend knees and hips, dorsi and plantar flexion is strong bilaterally. Speech is clear and grips are equal and strong bilaterally.

## 2021-08-10 NOTE — Transfer of Care (Signed)
Immediate Anesthesia Transfer of Care Note  Patient: Troy Shelton  Procedure(s) Performed: LEFT L4-5 FAR LATERAL DISCECTOMY (Left)  Patient Location: PACU   Anesthesia Type:General  Level of Consciousness: drowsy and patient cooperative  Airway & Oxygen Therapy: Patient Spontanous Breathing and Patient connected to nasal cannula oxygen  Post-op Assessment: Report given to RN and Post -op Vital signs reviewed and stable  Post vital signs: Reviewed and stable  Last Vitals:  Vitals Value Taken Time  BP 136/90 08/10/21 0905  Temp 36.1 C 08/10/21 0905  Pulse 72 08/10/21 0910  Resp 16 08/10/21 0910  SpO2 97 % 08/10/21 0910  Vitals shown include unvalidated device data.  Last Pain:  Vitals:   08/10/21 0905  TempSrc:   PainSc: Asleep         Complications: No notable events documented.

## 2021-08-10 NOTE — Progress Notes (Signed)
Turned patient. Honeycomb Dressing is dry, clean, and intact. Patient denies pain at this time.

## 2021-08-10 NOTE — Op Note (Signed)
Indications: The patient is a 56 yo male who presented with lumbar radiculopathy who failed conservative management.  Findings: far lateral disc herniation at L4/5.  Preoperative Diagnosis: Lumbar radiculopathy, Left leg weakness Postoperative Diagnosis: same   EBL: 10 ml IVF: 1000 ml Drains: none Disposition: Extubated and Stable to PACU Complications: none  No foley catheter was placed.   Preoperative Note:   Risks of surgery discussed include: infection, bleeding, stroke, coma, death, paralysis, CSF leak, nerve/spinal cord injury, numbness, tingling, weakness, complex regional pain syndrome, recurrent stenosis and/or disc herniation, vascular injury, development of instability, neck/back pain, need for further surgery, persistent symptoms, development of deformity, and the risks of anesthesia. The patient understood these risks and agreed to proceed.  Operative Note:   1) Left L4/5 Far lateral discectomy and foraminotomy  The patient was then brought from the preoperative center with intravenous access established.  The patient underwent general anesthesia and endotracheal tube intubation, and was then rotated on the [place] rail top where all pressure points were appropriately padded.  The skin was then thoroughly cleansed.  Perioperative antibiotic prophylaxis was administered.  Sterile prep and drapes were then applied and a timeout was then observed.  C-arm was brought into the field under sterile conditions, and the L4/5 disc space identified and marked with an incision on the left ~4cm lateral to midline.   Once this was complete a 2 cm incision was opened with the use of a #10 blade knife.  The Metrx tubes were sequentially advanced under lateral fluoroscopy until a 18 x 80 mm Metrx tube was placed over the left L5 transverse process and secured to the bed.    The microscope was then sterilely brought into the field and muscle creep was hemostased with a bipolar and resected  with a pituitary rongeur.  A Bovie extender was then used to expose the transverse process and lateral facet.  Careful attention was placed to not violate the facet capsule. A 3 mm matchstick drill bit was then used to drill off the top of the L5 transverse process, lateral L4/5 facet, and lateral pars.  The intertransverse membrane was identified, and carefully dissected free from the caudal transverse process.    Using careful dissected, the superior aspect of the L5 pedicle was identified and exposed down to its junction with the vertebral body.  The disc was identified. The disc was entered using a small Penfield 4. The L4 nerve root was identified and freed using a Penfield 4 and balltip probe.  The disc herniation was identified and dissected free using a balltip probe. The pituitary rongeur was used to remove the extruded disc fragments. Once the nerve root were noted to be relaxed and under less tension the ball-tipped feeler was passed along the foramen proximally to to ensure no residual compression was noted.    Depo-Medrol was placed along the nerve root.  The area was irrigated. The tube system was then removed under microscopic visualization and hemostasis was obtained with a bipolar.    The fascial layer was reapproximated with the use of a 0- Vicryl suture.  Subcutaneous tissue layer was reapproximated using 2-0 Vicryl suture.  3-0 monocryl was used on the skin. The skin was then cleansed and Dermabond was used to close the skin opening.  Patient was then rotated back to the preoperative bed awakened from anesthesia and taken to recovery all counts are correct in this case.   I performed the entire procedure with the assistance of Cooper Render  PA as an Pensions consultant.  Meade Maw MD

## 2021-08-10 NOTE — H&P (Signed)
I have reviewed and confirmed my history and physical from 07/29/21 with no additions or changes. Plan for L L4-5 far lateral discectomy.  Risks and benefits reviewed.  Heart sounds normal no MRG. Chest Clear to Auscultation Bilaterally.

## 2021-08-10 NOTE — Anesthesia Preprocedure Evaluation (Signed)
Anesthesia Evaluation  Patient identified by MRN, date of birth, ID band Patient awake    Reviewed: Allergy & Precautions, NPO status , Patient's Chart, lab work & pertinent test results  History of Anesthesia Complications Negative for: history of anesthetic complications  Airway Mallampati: II  TM Distance: >3 FB Neck ROM: Full    Dental  (+) Teeth Intact   Pulmonary neg pulmonary ROS, neg sleep apnea, neg COPD, Patient abstained from smoking.Not current smoker,    Pulmonary exam normal breath sounds clear to auscultation       Cardiovascular Exercise Tolerance: Good METS(-) hypertension(-) CAD and (-) Past MI negative cardio ROS  (-) dysrhythmias  Rhythm:Regular Rate:Normal - Systolic murmurs    Neuro/Psych PSYCHIATRIC DISORDERS Anxiety  Neuromuscular disease    GI/Hepatic GERD  Controlled and Medicated,(+)     (-) substance abuse  , Hepatitis -, B  Endo/Other  neg diabetes  Renal/GU negative Renal ROS     Musculoskeletal   Abdominal   Peds  Hematology   Anesthesia Other Findings Past Medical History: No date: Anxiety No date: Enlarged prostate No date: GERD (gastroesophageal reflux disease) No date: Hepatitis     Comment:  Hep B  Reproductive/Obstetrics                             Anesthesia Physical Anesthesia Plan  ASA: 2  Anesthesia Plan: General   Post-op Pain Management: Ofirmev IV (intra-op) and Dilaudid IV   Induction: Intravenous  PONV Risk Score and Plan: 3 and Ondansetron, Dexamethasone and Midazolam  Airway Management Planned: Oral ETT  Additional Equipment: None  Intra-op Plan:   Post-operative Plan: Extubation in OR  Informed Consent: I have reviewed the patients History and Physical, chart, labs and discussed the procedure including the risks, benefits and alternatives for the proposed anesthesia with the patient or authorized representative who has  indicated his/her understanding and acceptance.     Dental advisory given  Plan Discussed with: CRNA and Surgeon  Anesthesia Plan Comments: (Discussed risks of anesthesia with patient, including PONV, sore throat, lip/dental/eye damage. Rare risks discussed as well, such as cardiorespiratory and neurological sequelae, and allergic reactions. Discussed the role of CRNA in patient's perioperative care. Patient understands.)        Anesthesia Quick Evaluation

## 2021-08-10 NOTE — Anesthesia Procedure Notes (Signed)
Procedure Name: Intubation Date/Time: 08/10/2021 7:25 AM Performed by: Gayland Curry, CRNA Pre-anesthesia Checklist: Patient identified, Emergency Drugs available, Suction available and Patient being monitored Patient Re-evaluated:Patient Re-evaluated prior to induction Oxygen Delivery Method: Circle system utilized Preoxygenation: Pre-oxygenation with 100% oxygen Induction Type: IV induction Ventilation: Mask ventilation without difficulty Laryngoscope Size: Mac and 4 Grade View: Grade II Tube type: Oral Tube size: 7.0 mm Number of attempts: 1 Placement Confirmation: ETT inserted through vocal cords under direct vision, positive ETCO2 and breath sounds checked- equal and bilateral Secured at: 22 cm Tube secured with: Tape Dental Injury: Teeth and Oropharynx as per pre-operative assessment

## 2021-08-10 NOTE — Anesthesia Postprocedure Evaluation (Signed)
Anesthesia Post Note  Patient: Troy Shelton  Procedure(s) Performed: LEFT L4-5 FAR LATERAL DISCECTOMY (Left)  Patient location during evaluation: PACU Anesthesia Type: General Level of consciousness: awake and alert Pain management: pain level controlled Vital Signs Assessment: post-procedure vital signs reviewed and stable Respiratory status: spontaneous breathing, nonlabored ventilation, respiratory function stable and patient connected to nasal cannula oxygen Cardiovascular status: blood pressure returned to baseline and stable Postop Assessment: no apparent nausea or vomiting Anesthetic complications: no   No notable events documented.   Last Vitals:  Vitals:   08/10/21 1124 08/10/21 1155  BP: (!) 135/92 (!) 129/91  Pulse: 83 84  Resp: 16 18  Temp:  36.4 C  SpO2: 96% 95%    Last Pain:  Vitals:   08/10/21 1155  TempSrc: Oral  PainSc: 2                  Arita Miss

## 2021-08-10 NOTE — Discharge Instructions (Addendum)
Your surgeon has performed an operation on your lumbar spine (low back) to relieve pressure on one or more nerves. Many times, patients feel better immediately after surgery and can overdo it. Even if you feel well, it is important that you follow these activity guidelines. If you do not let your back heal properly from the surgery, you can increase the chance of a disc herniation and/or return of your symptoms. The following are instructions to help in your recovery once you have been discharged from the hospital.  * Do not take anti-inflammatory medications for 3 days after surgery (naproxen [Aleve], ibuprofen [Advil, Motrin], celecoxib [Celebrex], etc.)  Activity    No bending, lifting, or twisting (BLT). Avoid lifting objects heavier than 10 pounds (gallon milk jug).  Where possible, avoid household activities that involve lifting, bending, pushing, or pulling such as laundry, vacuuming, grocery shopping, and childcare. Try to arrange for help from friends and family for these activities while your back heals.  Increase physical activity slowly as tolerated.  Taking short walks is encouraged, but avoid strenuous exercise. Do not jog, run, bicycle, lift weights, or participate in any other exercises unless specifically allowed by your doctor. Avoid prolonged sitting, including car rides.  Talk to your doctor before resuming sexual activity.  You should not drive until cleared by your doctor.  Until released by your doctor, you should not return to work or school.  You should rest at home and let your body heal.   You may shower two days after your surgery.  After showering, lightly dab your incision dry. Do not take a tub bath or go swimming for 3 weeks, or until approved by your doctor at your follow-up appointment.  If you smoke, we strongly recommend that you quit.  Smoking has been proven to interfere with normal healing in your back and will dramatically reduce the success rate of  your surgery. Please contact [facility] (800-QUIT-NOW) and use the resources at www.[facility].com for assistance in stopping smoking.  Surgical Incision   If you have a dressing on your incision, you may remove it two days after your surgery. Keep your incision area clean and dry.  Your incision was closed with Dermabond glue. The glue should begin to peel away within about a week  Diet            You may return to your usual diet. Be sure to stay hydrated.  When to Contact us  Although your surgery and recovery will likely be uneventful, you may have some residual numbness, aches, and pains in your back and/or legs. This is normal and should improve in the next few weeks.  However, should you experience any of the following, contact us immediately: New numbness or weakness Pain that is progressively getting worse, and is not relieved by your pain medications or rest Bleeding, redness, swelling, pain, or drainage from surgical incision Chills or flu-like symptoms Fever greater than 101.0 F (38.3 C) Problems with bowel or bladder functions Difficulty breathing or shortness of breath Warmth, tenderness, or swelling in your calf  Contact Information During office hours (Monday-Friday 9 am to 5 pm), please call your physician at [phone] After hours and weekends, please call [phone] and speak with the answering service, who will contact the doctor on call.  If that fails, call the [place] Operator at [phone] and ask for the Neurosurgery Resident On Call  For a life-threatening emergency, call [place]   The  drugs that you were given will stay in your system until tomorrow so for the next 24 hours you should not:  Drive an automobile Make any legal decisions Drink any alcoholic beverage   You may resume regular meals tomorrow.  Today it is better to start with liquids and gradually work up to solid foods.  You may eat  anything you prefer, but it is better to start with liquids, then soup and crackers, and gradually work up to solid foods.   Please notify your doctor immediately if you have any unusual bleeding, trouble breathing, redness and pain at the surgery site, drainage, fever, or pain not relieved by medication.    Additional Instructions:        Please contact your physician with any problems or Same Day Surgery at [phone], Monday through Friday 6 am to 4 pm, or [place] at [facility] number at [phone].

## 2021-08-10 NOTE — Discharge Summary (Signed)
Physician Discharge Summary  Patient ID: Troy Shelton MRN: [id number] DOB/AGE: May 24, 1966 56 y.o.  Admit date: 08/10/2021 Discharge date: 08/10/2021  Admission Diagnoses: lumbar radiculopathy  Discharge Diagnoses:  Active Problems:   * No active hospital problems. *   Discharged Condition: good  Hospital Course:  Troy Shelton is a 56 y.o presenting with lumbar radiculopathy s/p left L4-5 far lateral discectomy on 08/10/20. His interoperative course was uncomplicated. He was monitored in PACU post-op and discharged home after ambulating, urinating, and tolerating PO intake. He was given prescriptions for Percocet, Robaxin, and Senna.  Consults: None  Significant Diagnostic Studies: none  Treatments: surgery: as above. Please see separately dictated operative report for further details.   Discharge Exam: Blood pressure (!) 142/96, pulse 73, temperature 97.9 F (36.6 C), temperature source Temporal, resp. rate 16, height 6\' 1"  (1.854 m), weight 102.1 kg, SpO2 97 %. CN II-XII grossly intact 5/5 throughout BLE Incision c/d/I with clean post-op bandage in place  Disposition: Discharge disposition: 01-Home or Self Care        Allergies as of 08/10/2021   No Known Allergies      Medication List     STOP taking these medications    HYDROcodone-acetaminophen 5-325 MG tablet Commonly known as: NORCO/VICODIN       TAKE these medications    methocarbamol 500 MG tablet Commonly known as: Robaxin Take 1 tablet (500 mg total) by mouth 4 (four) times daily.   oxyCODONE-acetaminophen 7.5-325 MG tablet Commonly known as: Percocet Take 1 tablet by mouth every 6 (six) hours as needed for up to 5 days for severe pain.   pantoprazole 40 MG tablet Commonly known as: PROTONIX Take 40 mg by mouth in the morning.   senna 8.6 MG Tabs tablet Commonly known as: SENOKOT Take 1 tablet (8.6 mg total) by mouth daily as needed for mild constipation.   sertraline 100 MG  tablet Commonly known as: ZOLOFT Take 100 mg by mouth in the morning.   tamsulosin 0.4 MG Caps capsule Commonly known as: FLOMAX Take 0.4 mg by mouth daily.   Vemlidy 25 MG Tabs Generic drug: Tenofovir Alafenamide Fumarate Take 25 mg by mouth in the morning.        Follow-up Information     Loleta Dicker, PA Follow up in 2 week(s).   Why: for incision check Contact information: [place] [address] [phone]                 Signed: Loleta Dicker 08/10/2021, 9:05 AM

## 2021-10-07 ENCOUNTER — Other Ambulatory Visit: Payer: Self-pay | Admitting: Specialist

## 2021-10-07 DIAGNOSIS — R7989 Other specified abnormal findings of blood chemistry: Secondary | ICD-10-CM

## 2021-10-07 DIAGNOSIS — K7030 Alcoholic cirrhosis of liver without ascites: Secondary | ICD-10-CM

## 2021-10-07 DIAGNOSIS — K760 Fatty (change of) liver, not elsewhere classified: Secondary | ICD-10-CM

## 2021-10-07 DIAGNOSIS — Z86010 Personal history of colonic polyps: Secondary | ICD-10-CM

## 2021-10-07 DIAGNOSIS — B181 Chronic viral hepatitis B without delta-agent: Secondary | ICD-10-CM

## 2021-10-21 ENCOUNTER — Other Ambulatory Visit: Payer: Self-pay | Admitting: Neurosurgery

## 2021-10-21 DIAGNOSIS — M5416 Radiculopathy, lumbar region: Secondary | ICD-10-CM

## 2021-10-22 ENCOUNTER — Ambulatory Visit
Admission: RE | Admit: 2021-10-22 | Discharge: 2021-10-22 | Disposition: A | Discharge status: Home / Self Care | Payer: Managed Care, Other (non HMO) | Source: Ambulatory Visit | Attending: Neurosurgery | Admitting: Neurosurgery

## 2021-10-22 DIAGNOSIS — M5416 Radiculopathy, lumbar region: Secondary | ICD-10-CM

## 2021-10-22 MED ORDER — IOPAMIDOL (ISOVUE-M 200) INJECTION 41%
1.0000 mL | Freq: Once | INTRAMUSCULAR | Status: AC
  Administered 2021-10-22: 1 mL via EPIDURAL

## 2021-10-22 MED ORDER — METHYLPREDNISOLONE ACETATE 40 MG/ML INJ SUSP (RADIOLOG
80.0000 mg | Freq: Once | INTRAMUSCULAR | Status: AC
  Administered 2021-10-22: 80 mg via EPIDURAL

## 2021-10-22 NOTE — Discharge Instructions (Signed)

## 2021-10-30 ENCOUNTER — Ambulatory Visit
Admission: RE | Admit: 2021-10-30 | Discharge: 2021-10-30 | Disposition: A | Discharge status: Home / Self Care | Payer: Managed Care, Other (non HMO) | Source: Ambulatory Visit | Attending: Specialist | Admitting: Specialist

## 2021-10-30 DIAGNOSIS — B181 Chronic viral hepatitis B without delta-agent: Secondary | ICD-10-CM

## 2021-10-30 DIAGNOSIS — K760 Fatty (change of) liver, not elsewhere classified: Secondary | ICD-10-CM

## 2021-10-30 DIAGNOSIS — R7989 Other specified abnormal findings of blood chemistry: Secondary | ICD-10-CM

## 2021-10-30 DIAGNOSIS — K7030 Alcoholic cirrhosis of liver without ascites: Secondary | ICD-10-CM

## 2021-10-30 DIAGNOSIS — Z86010 Personal history of colonic polyps: Secondary | ICD-10-CM

## 2021-10-30 IMAGING — MR MR ABDOMEN WO/W CM MRCP
17 of 19 series · 42 of 48 positions shown · IV contrast (multihance)
Comparison: [DATE]

CLINICAL DATA: Cirrhosis of the liver.

EXAM:
MRI ABDOMEN WITHOUT AND WITH CONTRAST (INCLUDING MRCP)
TECHNIQUE: Multiplanar multisequence MR imaging of the abdomen was performed
both before and after the administration of intravenous contrast.
Heavily T2-weighted images of the biliary and pancreatic ducts were
obtained, and three-dimensional MRCP images were rendered by post
processing.
CONTRAST:  20mL MULTIHANCE GADOBENATE DIMEGLUMINE 529 MG/ML IV SOLN

[Series 3: T2 · coronal · 4.5mm · 1.56mm/px · 2 of 41 slices shown (1 of 4)]
[im 1/41]
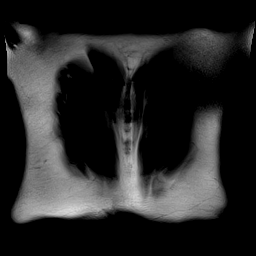
[im 41/41]
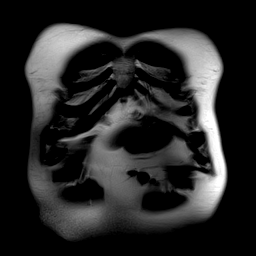

[Series 4: T2 · coronal · 3.0mm · 1.19mm/px · 3 of 58 slices shown (2 of 4)]
[im 1/58]
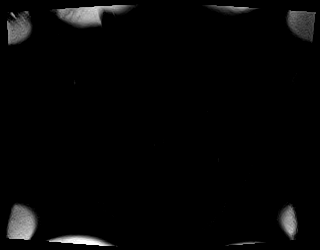
[im 29/58]
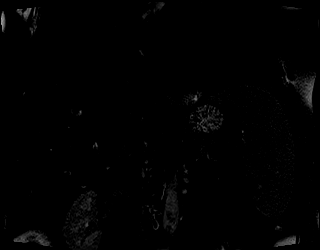
[im 58/58]
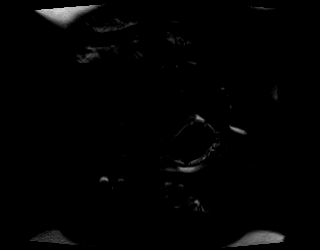

[Series 5: T1 · axial · 3.0mm · 1.19mm/px · z∈[-141,+120]mm · 7 of 176 slices shown]
[im 1/176]
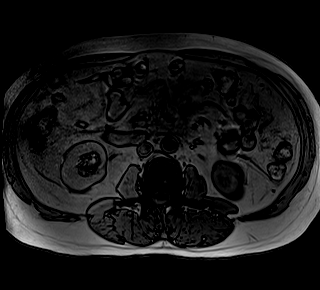
[im 30/176]
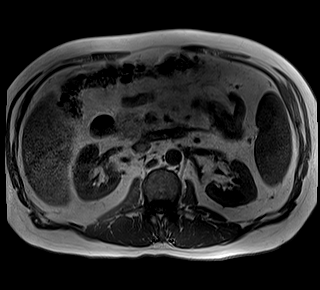
[im 59/176]
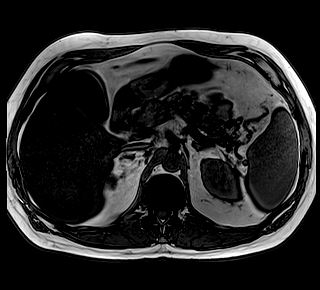
[im 88/176]
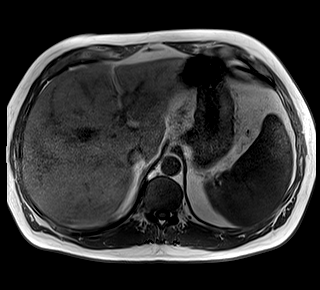
[im 117/176]
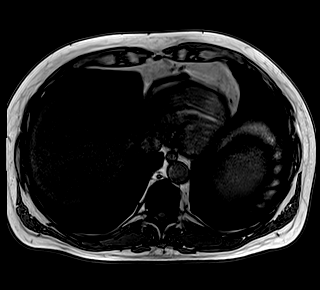
[im 146/176]
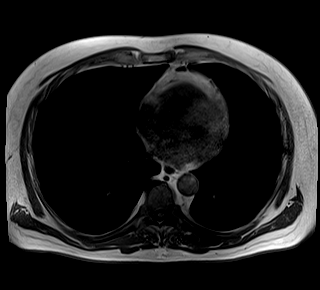
[im 176/176]
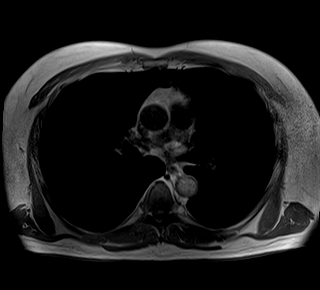

[Series 6: T2 · axial · 6.0mm · 1.19mm/px · 1 of 36 slices shown (3 of 4)]
[im 1/36]
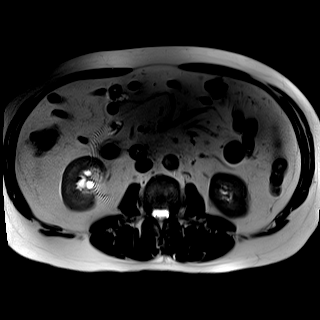

[Series 7: bSSFP · axial · 6.0mm · 1.25mm/px · 1 of 39 slices shown (1 of 2)]
[im 1/39]
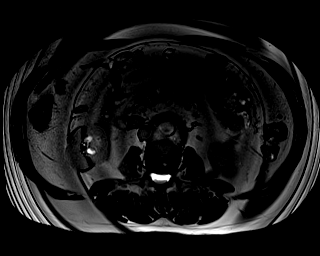

[Series 8: bSSFP · coronal · 5.0mm · 1.25mm/px · 1 of 35 slices shown (2 of 2)]
[im 1/35]
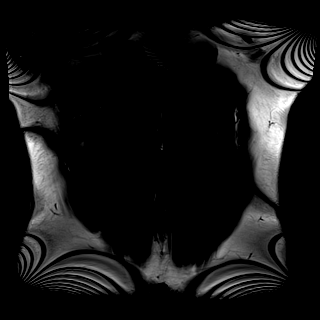

[Series 11: T2 · axial · 5.0mm · 1.48mm/px · 1 of 42 slices shown (4 of 4)]
[im 1/42]
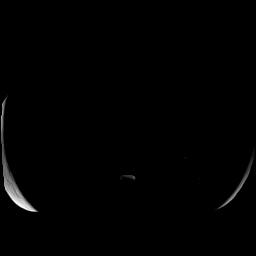

[Series 13: MRCP · coronal · 1.0mm · 0.49mm/px · 2 of 64 slices shown]
[im 1/64]
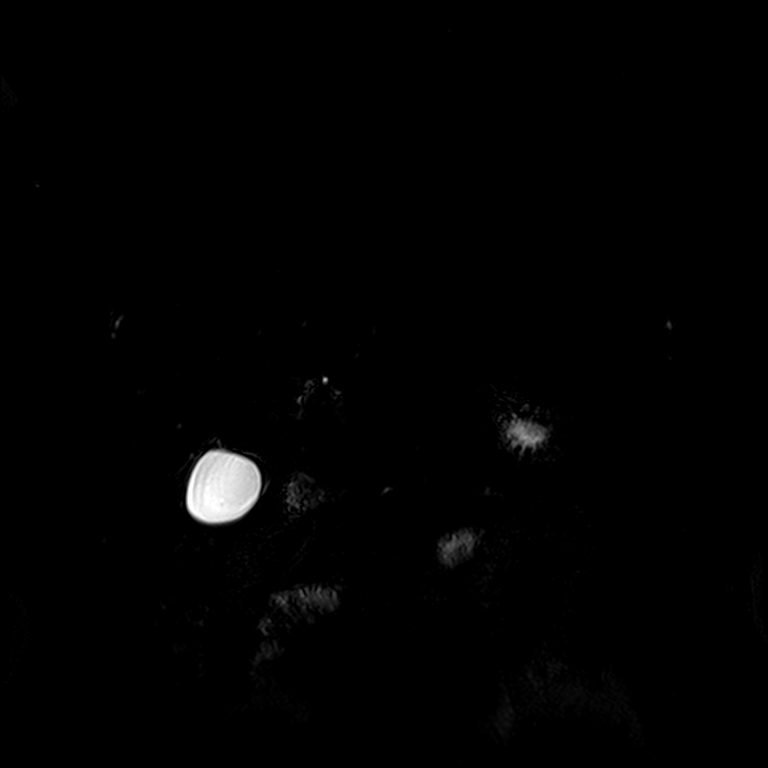
[im 64/64]
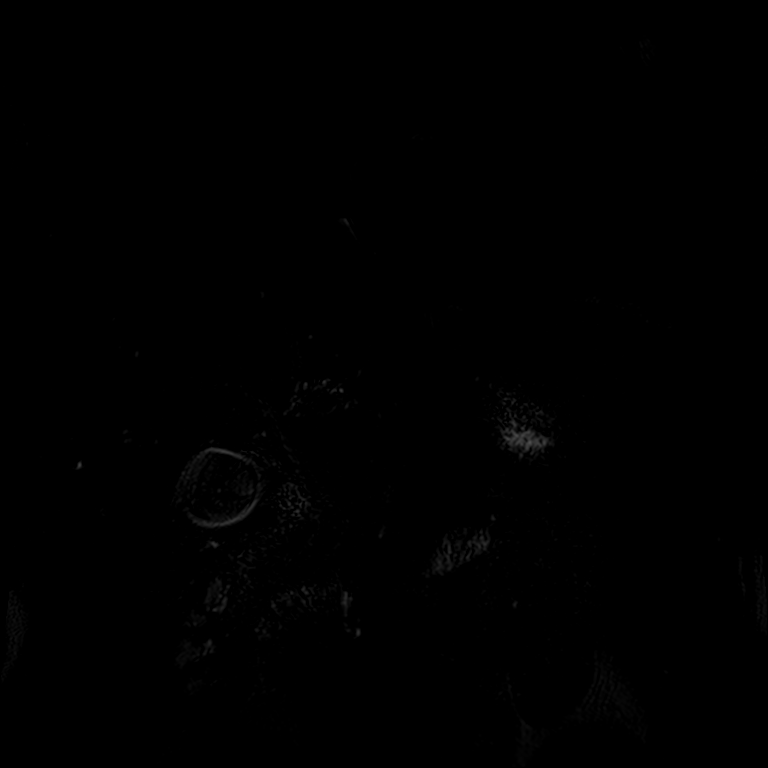

[Series 15: DWI · axial · 5.0mm · 1.42mm/px · z∈[-134,+112]mm · 4 of 126 slices shown (1 of 2)]
[im 1/126]
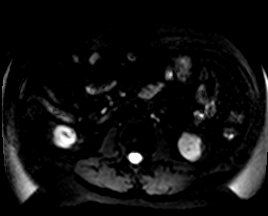
[im 42/126]
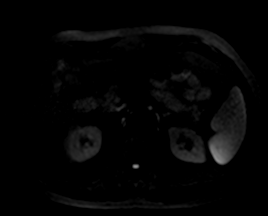
[im 84/126]
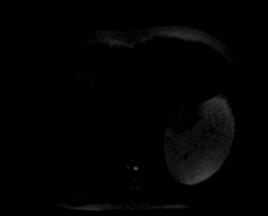
[im 126/126]
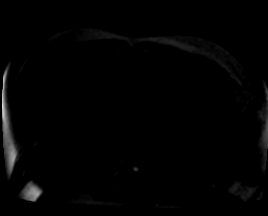

[Series 16: DWI · axial · 5.0mm · 1.42mm/px · 1 of 42 slices shown (2 of 2)]
[im 1/42]
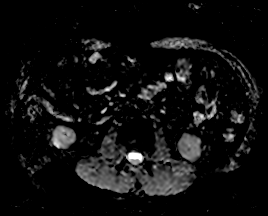

[Series 17: T1 dynamic · axial · non-contrast · 3.0mm · 1.25mm/px · z∈[-159,+102]mm · 3 of 88 slices shown]
[im 1/88]
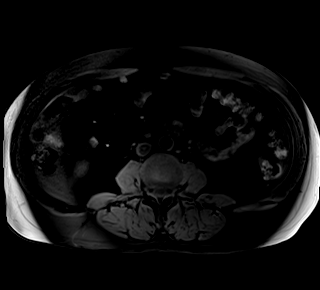
[im 44/88]
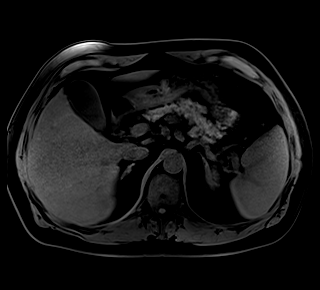
[im 88/88]
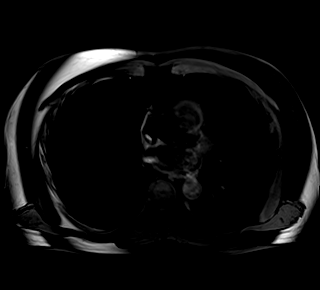

[Series 18: T1 dynamic post-contrast · axial · 3.0mm · 1.25mm/px · z∈[-159,+102]mm · 3 of 88 slices shown (1 of 6)]
[im 1/88]
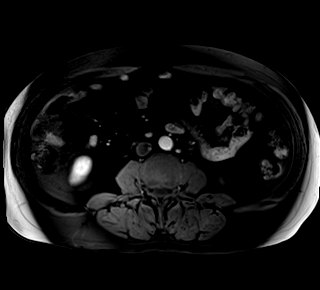
[im 44/88]
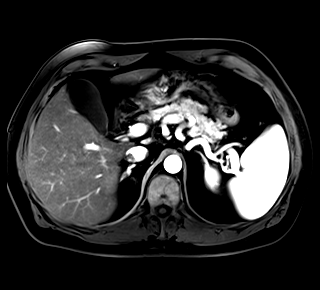
[im 88/88]
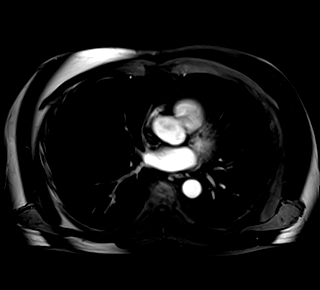

[Series 19: T1 dynamic post-contrast · axial · 3.0mm · 1.25mm/px · z∈[-159,+102]mm · 3 of 88 slices shown (2 of 6)]
[im 1/88]
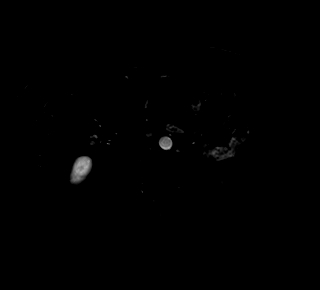
[im 44/88]
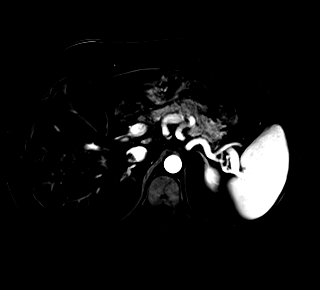
[im 88/88]
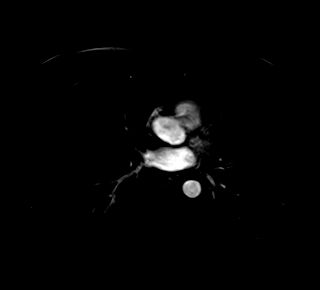

[Series 20: T1 dynamic post-contrast · axial · 3.0mm · 1.25mm/px · z∈[-159,+102]mm · 3 of 88 slices shown (3 of 6)]
[im 1/88]
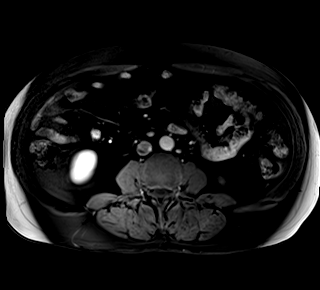
[im 44/88]
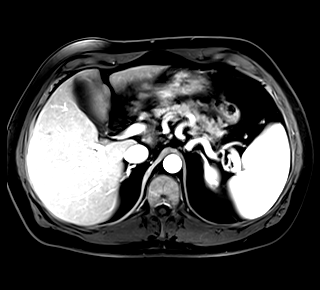
[im 88/88]
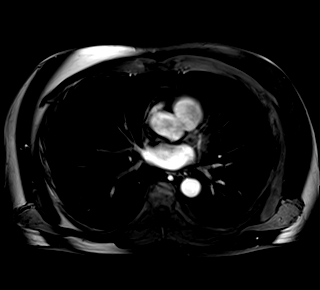

[Series 21: T1 dynamic post-contrast · axial · 3.0mm · 1.25mm/px · z∈[-159,+102]mm · 3 of 88 slices shown (4 of 6)]
[im 1/88]
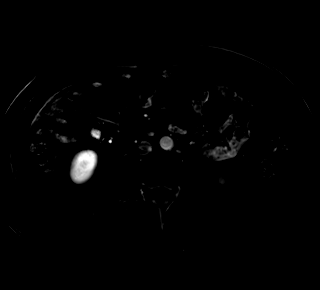
[im 44/88]
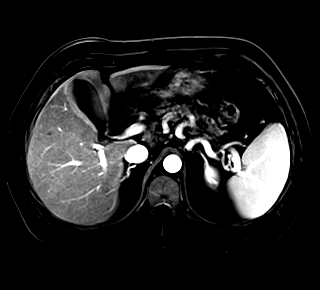
[im 88/88]
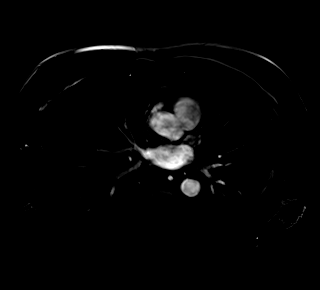

[Series 22: T1 dynamic post-contrast · axial · 3.0mm · 1.25mm/px · z∈[-159,+102]mm · 3 of 88 slices shown (5 of 6)]
[im 1/88]
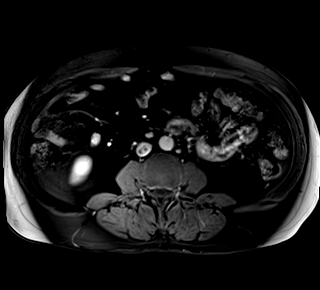
[im 44/88]
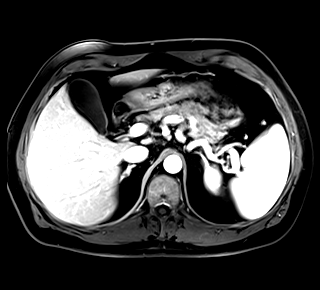
[im 88/88]
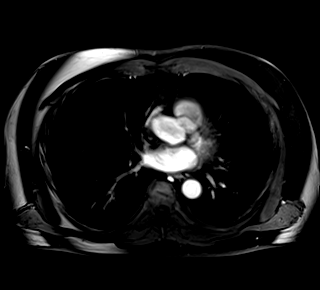

[Series 23: T1 dynamic post-contrast · axial · 3.0mm · 1.25mm/px · 1 of 88 slices shown (6 of 6)]
[im 1/88]
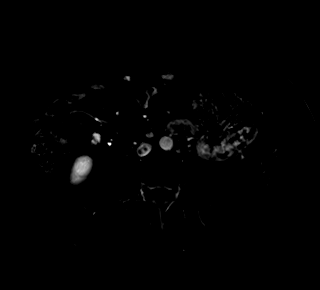

[42 of 48 positions shown; findings below may reference images not displayed]

FINDINGS: Lower chest: No acute findings.

Hepatobiliary: Diffuse hepatic steatosis. Relative hypertrophy of
the caudate lobe of liver noted. Contour the liver is slightly
nodular. No enhancing liver lesions identified to suggest hepatoma.
A few tiny liver cysts are identified measuring up to 5 mm.

The gallbladder appears normal. No gallstones or gallbladder wall
inflammation. No bile duct dilatation.

Pancreas: No mass, inflammatory changes, or other parenchymal
abnormality identified.

Spleen: The spleen is enlarged measuring 16 cm in cranial caudal
dimension.

Adrenals/Urinary Tract: Normal appearance of the adrenal glands. No
kidney mass or hydronephrosis identified.

Stomach/Bowel: Visualized portions within the abdomen are
unremarkable.

Vascular/Lymphatic: The upper abdominal vasculature is patent.
Normal appearance of the abdominal aorta. No adenopathy.

Other:  No ascites or focal fluid collections.

Musculoskeletal: No suspicious bone lesions identified.
IMPRESSION: 1. Morphologic features of the liver compatible with cirrhosis. No
suspicious liver lesions identified.
2. Splenomegaly.

## 2021-10-30 MED ORDER — GADOBENATE DIMEGLUMINE 529 MG/ML IV SOLN
20.0000 mL | Freq: Once | INTRAVENOUS | Status: AC | PRN
  Administered 2021-10-30: 20 mL via INTRAVENOUS

## 2021-12-21 ENCOUNTER — Other Ambulatory Visit: Payer: Self-pay | Admitting: Neurosurgery

## 2021-12-21 DIAGNOSIS — M5416 Radiculopathy, lumbar region: Secondary | ICD-10-CM

## 2022-01-05 ENCOUNTER — Ambulatory Visit
Admission: RE | Admit: 2022-01-05 | Discharge: 2022-01-05 | Disposition: A | Discharge status: Home / Self Care | Payer: Managed Care, Other (non HMO) | Source: Ambulatory Visit | Attending: Neurosurgery | Admitting: Neurosurgery

## 2022-01-05 DIAGNOSIS — M5416 Radiculopathy, lumbar region: Secondary | ICD-10-CM

## 2022-01-05 IMAGING — MR MR LUMBAR SPINE W/O CM
4 of 5 series · 26 of 48 positions shown · non-contrast
Comparison: [DATE]

CLINICAL DATA: Low back pain. Pain radiates to the left hip and
leg.

EXAM:
MRI LUMBAR SPINE WITHOUT CONTRAST
TECHNIQUE: Multiplanar, multisequence MR imaging of the lumbar spine was
performed. No intravenous contrast was administered.

[Series 3: T2 · sagittal · 4.0mm · 0.55mm/px · 5 of 15 slices shown (1 of 2)]
[im 1/15]
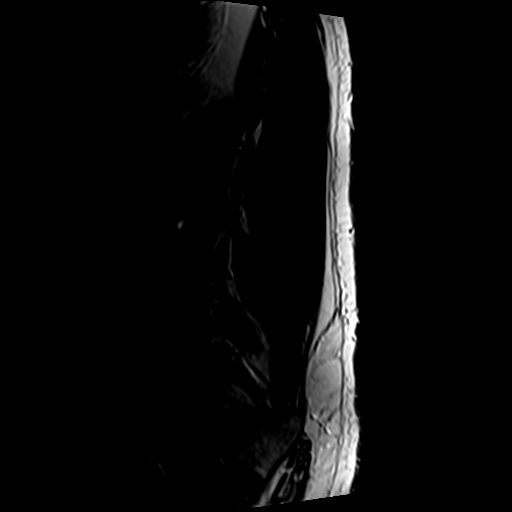
[im 4/15]
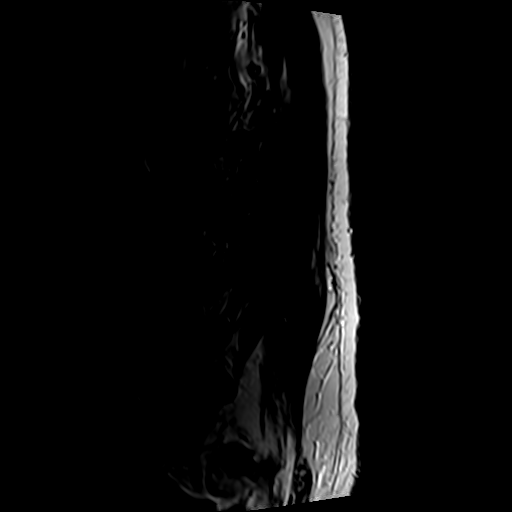
[im 8/15]
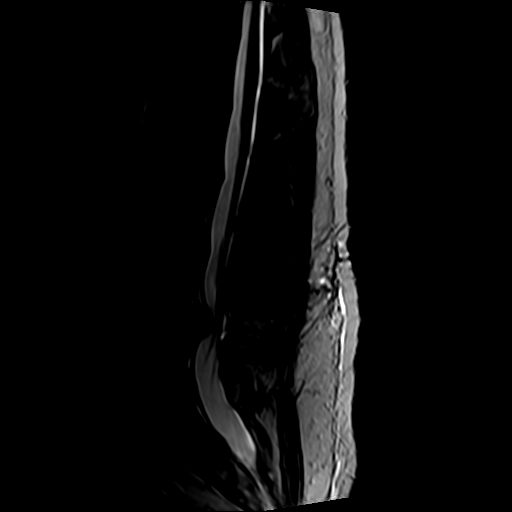
[im 11/15]
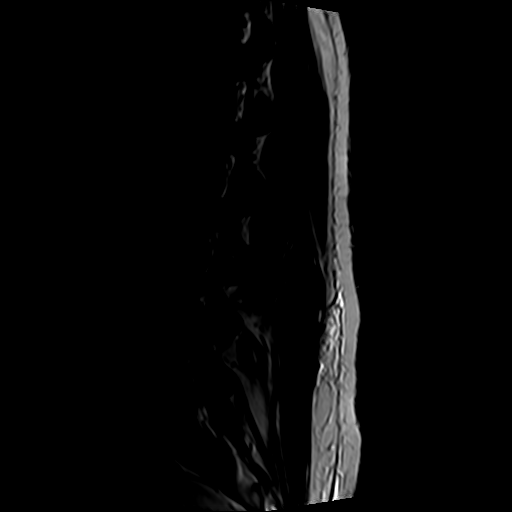
[im 15/15]
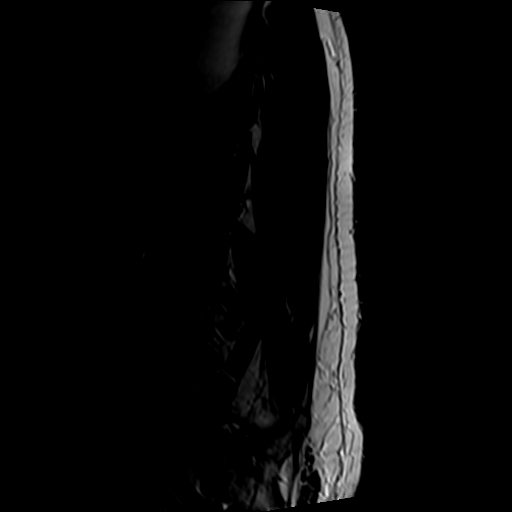

[Series 5: T1 · sagittal · 4.0mm · 0.55mm/px · 6 of 15 slices shown (1 of 2)]
[im 1/15]
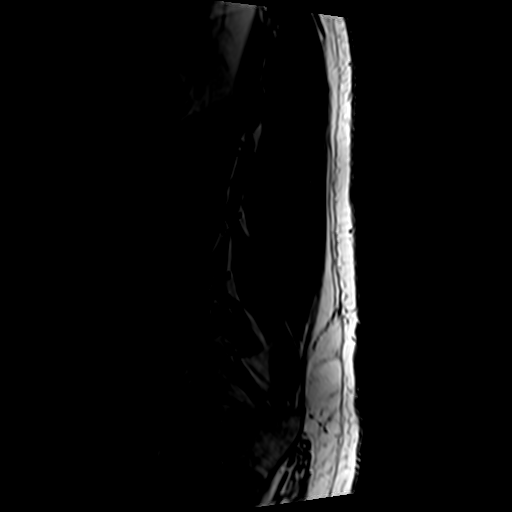
[im 3/15]
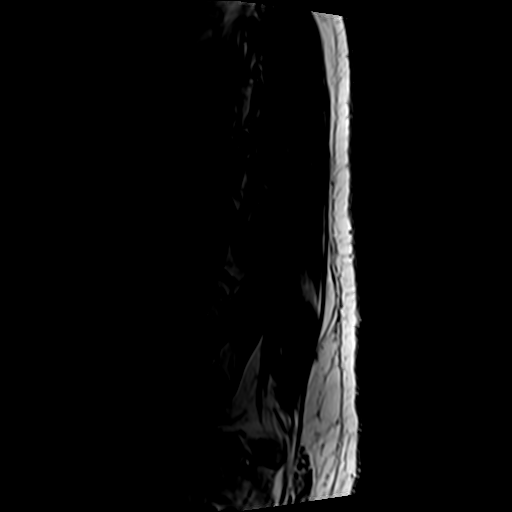
[im 6/15]
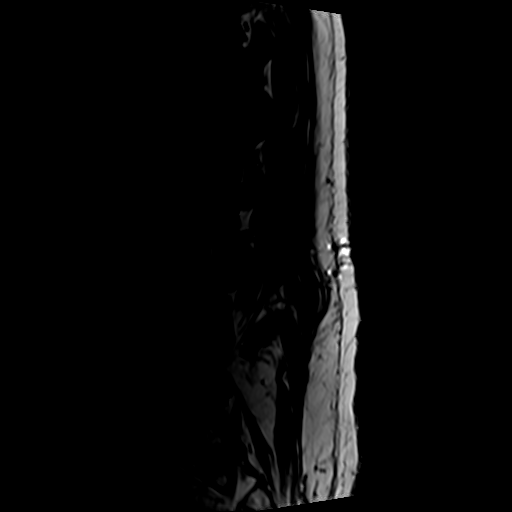
[im 9/15]
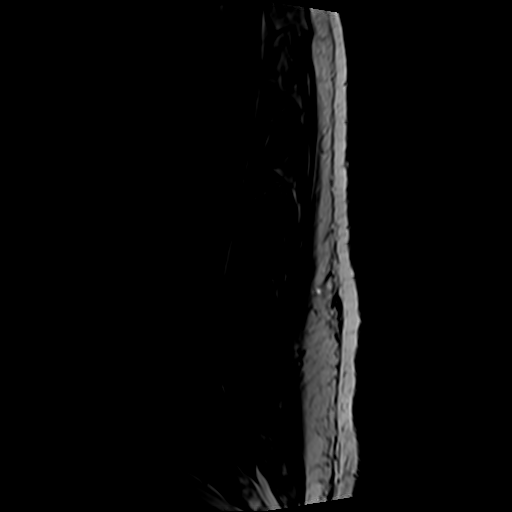
[im 12/15]
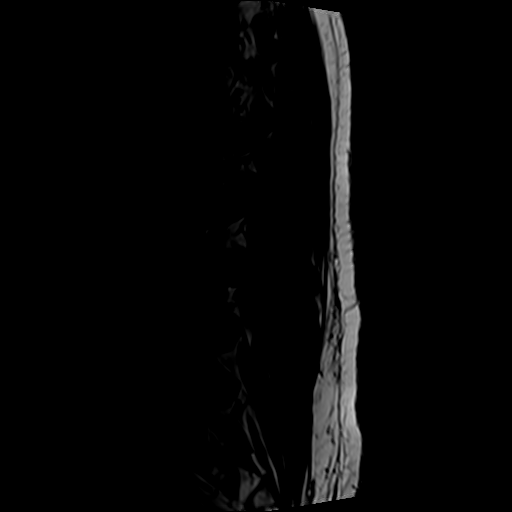
[im 15/15]
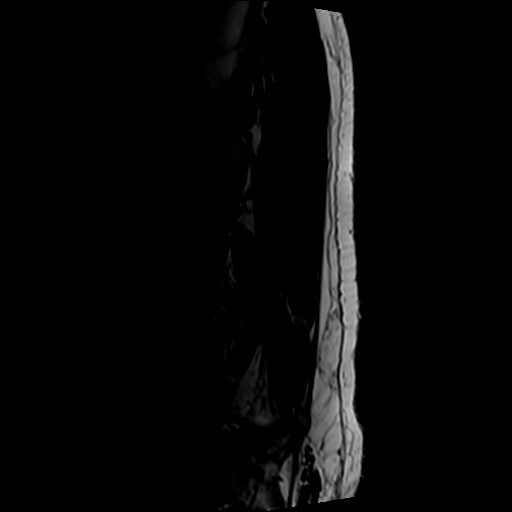

[Series 6: T2 · axial · 4.0mm · 0.70mm/px · z∈[-150,+73]mm · 10 of 42 slices shown (2 of 2)]
[im 3/42]
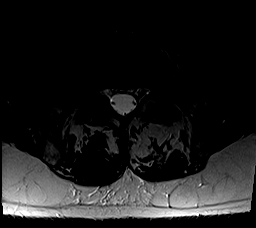
[im 6/42]
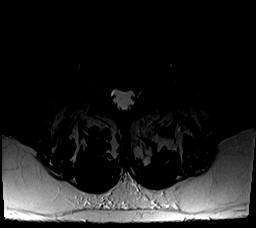
[im 9/42]
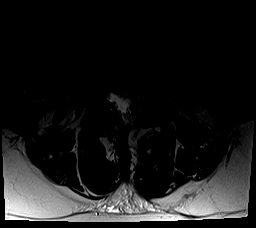
[im 14/42]
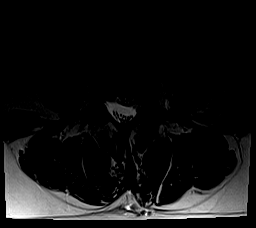
[im 20/42]
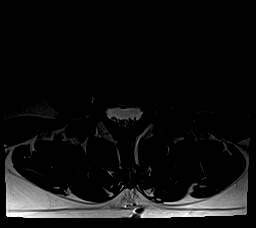
[im 22/42]
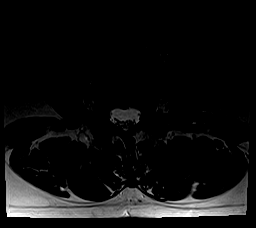
[im 25/42]
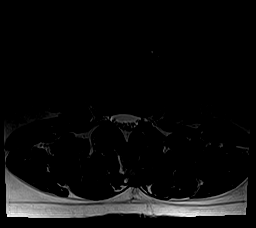
[im 31/42]
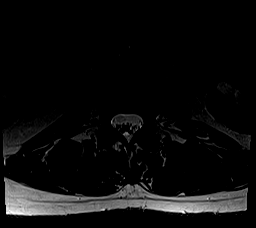
[im 36/42]
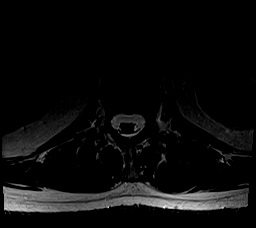
[im 42/42]
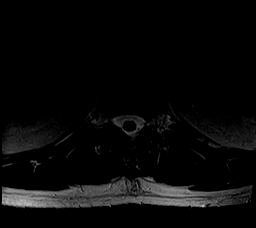

[Series 7: T1 · axial · 4.0mm · 0.35mm/px · z∈[-150,+42]mm · 5 of 42 slices shown (2 of 2)]
[im 3/42]
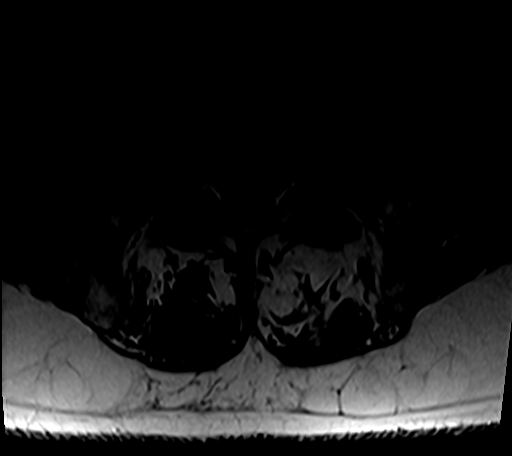
[im 6/42]
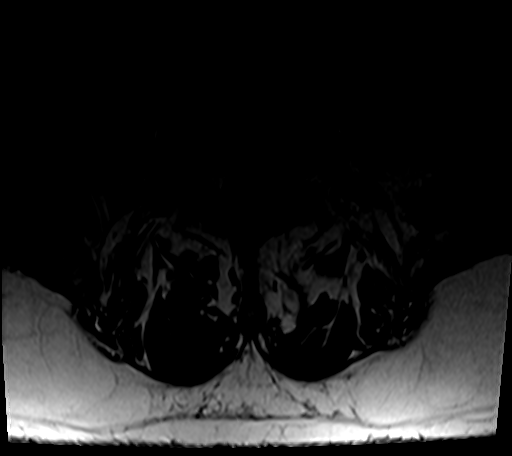
[im 9/42]
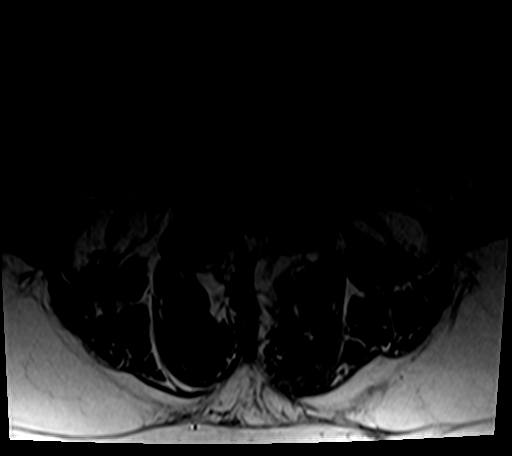
[im 22/42]
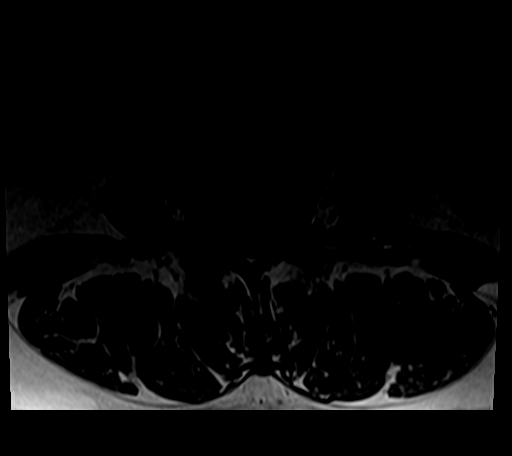
[im 36/42]
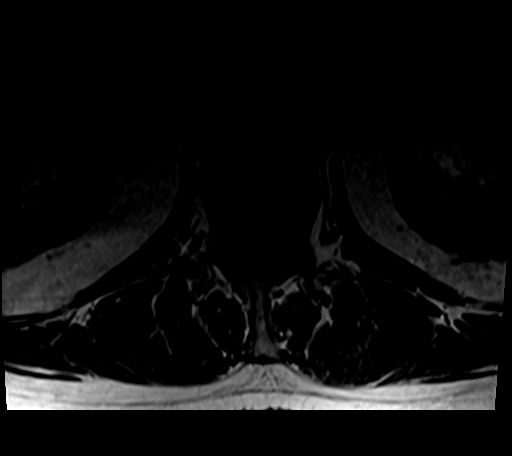

[26 of 48 positions shown; findings below may reference images not displayed]

FINDINGS: Segmentation:  Standard.

Alignment:  Physiologic.

Vertebrae: No acute fracture, evidence of discitis, or aggressive
bone lesion.

Conus medullaris and cauda equina: Conus extends to the L1-2 level.
Conus and cauda equina appear normal.

Paraspinal and other soft tissues: No acute paraspinal abnormality.

Disc levels:

Disc spaces: Disc desiccation at L2-3, L3-4, L4-5 and L5-S1. No disc
height loss.

T12-L1: No significant disc bulge. No neural foraminal stenosis. No
central canal stenosis.

L1-L2: No significant disc bulge. No neural foraminal stenosis. No
central canal stenosis.

L2-L3: No significant disc bulge. No neural foraminal stenosis. No
central canal stenosis. Mild bilateral facet arthropathy.

L3-L4: Mild broad-based disc bulge. Mild bilateral facet
arthropathy. Mild right subarticular recess narrowing. No foraminal
stenosis. Mild spinal stenosis.

L4-L5: Left laminectomy. Large left paracentral/foraminal disc
protrusion with mass effect on the left intraspinal nerve roots and
contacting the exiting L4 nerve root. Mild left foraminal stenosis.
No right foraminal stenosis. Moderate multifactorial spinal
stenosis. Mild bilateral facet arthropathy.

L5-S1: No significant disc bulge. No neural foraminal stenosis. No
central canal stenosis.
IMPRESSION: 1. At L4-5 there is a left laminectomy. Large left
paracentral/foraminal disc protrusion with mass effect on the left
intraspinal nerve roots and contacting the exiting L4 nerve root.
Mild left foraminal stenosis. No right foraminal stenosis. Moderate
multifactorial spinal stenosis. Mild bilateral facet arthropathy.

## 2022-01-15 ENCOUNTER — Ambulatory Visit: Payer: Managed Care, Other (non HMO) | Admitting: Rehabilitative and Restorative Service Providers"

## 2022-01-26 ENCOUNTER — Ambulatory Visit (INDEPENDENT_AMBULATORY_CARE_PROVIDER_SITE_OTHER): Payer: 59 | Admitting: Physical Therapy

## 2022-01-26 ENCOUNTER — Encounter: Payer: Self-pay | Admitting: Physical Therapy

## 2022-01-26 ENCOUNTER — Other Ambulatory Visit: Payer: Self-pay

## 2022-01-26 DIAGNOSIS — M5459 Other low back pain: Secondary | ICD-10-CM | POA: Diagnosis not present

## 2022-01-26 DIAGNOSIS — R293 Abnormal posture: Secondary | ICD-10-CM

## 2022-01-26 DIAGNOSIS — M6281 Muscle weakness (generalized): Secondary | ICD-10-CM | POA: Diagnosis not present

## 2022-01-26 NOTE — Therapy (Signed)
OUTPATIENT PHYSICAL THERAPY THORACOLUMBAR EVALUATION   Patient Name: Troy Shelton MRN: [id number] DOB:03/17/1966, 56 y.o., male Today's Date: 01/26/2022   PT End of Session - 01/26/22 1122     Visit Number 1    Number of Visits 6    Date for PT Re-Evaluation 03/09/22    Authorization Type [employer] - medical necessity needed after 12 visits    PT Start Time 1055    PT Stop Time 1122    PT Time Calculation (min) 27 min    Activity Tolerance Patient tolerated treatment well    Behavior During Therapy [facility] for tasks assessed/performed             Past Medical History:  Diagnosis Date   Anxiety    Enlarged prostate    GERD (gastroesophageal reflux disease)    Hepatitis    Hep B   Past Surgical History:  Procedure Laterality Date   CERVICAL DISCECTOMY     KNEE ARTHROSCOPY     LIPOMA EXCISION     neck/skull   LUMBAR DISC SURGERY     TONSILLECTOMY     Patient Active Problem List   Diagnosis Date Noted   GERD 12/18/2007   BENIGN PROSTATIC HYPERTROPHY, HX OF 12/18/2007   HEPATITIS B, CHRONIC 10/30/2007   COLONIC POLYPS 10/30/2007   ANXIETY 10/30/2007   FATTY LIVER DISEASE 10/30/2007   HEPATITIS B 10/16/2007    PCP: Anselmo Pickler, MD   REFERRING PROVIDER: Venetia Night, MD   REFERRING DIAG: [phone] (ICD-10-CM) - Other specified postprocedural states M54.16 (ICD-10-CM) - Radiculopathy, lumbar region   Rationale for Evaluation and Treatment Rehabilitation  THERAPY DIAG:  Other low back pain - Plan: PT plan of care cert/re-cert  Muscle weakness (generalized) - Plan: PT plan of care cert/re-cert  Abnormal posture - Plan: PT plan of care cert/re-cert  ONSET DATE: 08/10/21  SUBJECTIVE:                                                                                                                                                                                           SUBJECTIVE STATEMENT: Pt had Lt lateral discectomy in Jan 2023 and a few weeks after  surgery started to develop pain that radiates to Lt knee.  Standing and bending are the most difficulty for him.    PERTINENT HISTORY:  Depression, Gastroesophageal reflux disease without esophagitis, Left L4-5 far lateral discectomy 08/10/2021, Hepatitis B   PAIN:  Are you having pain? Yes: NPRS scale: 4-5 currently, up to 9-10, at best 2/10 Pain location: Lt low back to knee Pain description: sharp, shooting, burning Aggravating factors: prolonged sitting, standing, forward  bending Relieving factors: repositioning, lying flat on back on hard surface   PRECAUTIONS: Back  WEIGHT BEARING RESTRICTIONS No  FALLS:  Has patient fallen in last 6 months? Yes. 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 Large left paracentral/foraminal disc protrusion with mass effect on the left intraspinal nerve roots and contacting the exiting L4 nerve root.  PATIENT SURVEYS:  01/26/22: FOTO 42 (predicted 57)  SCREENING FOR RED FLAGS: Bowel or bladder incontinence: No Spinal tumors: No Cauda equina syndrome: No Compression fracture: No  COGNITION: Overall cognitive status: Within functional limits for tasks assessed     SENSATION: WFL  MUSCLE LENGTH: 01/26/22: Bil hamstring tightness noted  POSTURE: rounded shoulders, forward head, and in sitting leaning to Rt side  PALPATION: 01/26/22: Lt QL trigger points noted  LUMBAR ROM:   Active  A/PROM  eval   Flexion Limited 25%  Extension WNL  Right lateral flexion WNL  Left lateral flexion WNL with pain at lower back  Right rotation WNL  Left rotation WNL   (Blank rows = not tested)  LOWER EXTREMITY MMT:    MMT Right eval Left eval  Hip flexion 5/5 3+/5  Hip extension 5/5 3/5  Knee flexion 5/5 5/5  Knee extension 5/5 5/5  Ankle dorsiflexion 5/5 5/5   (Blank rows = not tested)  LUMBAR SPECIAL TESTS:  01/26/22: Straight leg raise test: Positive and Slump test: Positive  GAIT: Independent with amb  TODAY'S TREATMENT  01/26/22 See HEP - performed 3-5 reps of each exercise with reduction in pain after lateral shift and extension   PATIENT EDUCATION:  Education details: HEP Person educated: Patient Education method: Explanation, Demonstration, and Handouts Education comprehension: verbalized understanding, returned demonstration, and needs further education   HOME EXERCISE PROGRAM: Access Code: [email or web address] URL: [email or web address] Date: 01/26/2022 Prepared by: Moshe Cipro  Exercises - Left Standing Lateral Shift Correction at Wall - Repetitions  - 3-5 x daily - 7 x weekly - 1 sets - 10 reps - 10 sec hold - Prone Press Up  - 3-5 x daily - 7 x weekly - 1 sets - 10 reps - 10 sec hold - Standing Lumbar Extension at Wall - Forearms  - 3-5 x daily - 7 x weekly - 1 sets - 10 reps - 10 sec hold - Supine 90/90 Sciatic Nerve Glide with Knee Flexion/Extension  - 2-3 x daily - 7 x weekly - 1 sets - 10 reps - 5-10 sec hold  ASSESSMENT:  CLINICAL IMPRESSION: Patient is a 56 y.o. male who was seen today for physical therapy evaluation and treatment for LBP s/p Left L4-5 lateral discectomy 08/10/2021. He demonstrates decreased strength and ROM as well as positive neural tension tests affecting functional mobility.  Will benefit from PT to maximize function.    OBJECTIVE IMPAIRMENTS difficulty walking, decreased ROM, decreased strength, increased fascial  restrictions, increased muscle spasms, impaired flexibility, postural dysfunction, and pain.   ACTIVITY LIMITATIONS carrying, lifting, sitting, standing,  squatting, sleeping, transfers, and locomotion level  PARTICIPATION LIMITATIONS: cleaning, driving, community activity, occupation, and yard work  PERSONAL FACTORS 3+ comorbidities: Depression, Gastroesophageal reflux disease without esophagitis, Left L4-5 far lateral discectomy 08/10/2021, Hepatitis B   are also affecting patient's functional outcome.   REHAB POTENTIAL: Good  CLINICAL DECISION MAKING: Evolving/moderate complexity  EVALUATION COMPLEXITY: Moderate   GOALS: Goals reviewed with patient? Yes  SHORT TERM GOALS: Target date: 02/16/2022  Independent with initial HEP Goal status: INITIAL   LONG TERM GOALS: Target date: 03/09/2022  Independent with final HEP Goal status: INITIAL  2.  FOTO score improved to 57 Goal status: INITIAL  3.  Lt hip strength improved to 4/5 for improved function and mobility Goal status: INIITAL  4.  Report pain < 3/10 with standing and sitting for improved function Goal status: INITIAL  5.  Demonstrate centralizatoin of symptoms for improved function Goal status: INITIAL    PLAN: PT FREQUENCY: 1x/week  PT DURATION: 6 weeks  PLANNED INTERVENTIONS: Therapeutic exercises, Therapeutic activity, Neuromuscular re-education, Balance training, Gait training, Patient/Family education, Aquatic Therapy, Dry Needling, Electrical stimulation, Spinal manipulation, Spinal mobilization, Cryotherapy, Moist heat, Taping, Traction, Manual therapy, and Re-evaluation.  PLAN FOR NEXT SESSION: assess response to extension and continue if improved, consider traction, may need DN to QL     Clarita Crane, PT, DPT 01/26/22 11:33 AM

## 2022-02-10 ENCOUNTER — Encounter: Payer: Self-pay | Admitting: Rehabilitative and Restorative Service Providers"

## 2022-02-10 ENCOUNTER — Ambulatory Visit (INDEPENDENT_AMBULATORY_CARE_PROVIDER_SITE_OTHER): Payer: 59 | Admitting: Rehabilitative and Restorative Service Providers"

## 2022-02-10 DIAGNOSIS — R293 Abnormal posture: Secondary | ICD-10-CM

## 2022-02-10 DIAGNOSIS — M6281 Muscle weakness (generalized): Secondary | ICD-10-CM

## 2022-02-10 DIAGNOSIS — M5459 Other low back pain: Secondary | ICD-10-CM | POA: Diagnosis not present

## 2022-02-10 NOTE — Therapy (Signed)
OUTPATIENT PHYSICAL THERAPY TREATMENT NOTE   Patient Name: Troy Shelton MRN: [id number] DOB:27-Aug-1965, 56 y.o., male Today's Date: 02/10/2022  PCP: Lavone Orn, MD  REFERRING PROVIDER: Meade Maw, MD  END OF SESSION:   PT End of Session - 02/10/22 1139     Visit Number 2    Number of Visits 6    Date for PT Re-Evaluation 03/09/22    Authorization Type [employer] - medical necessity needed after 12 visits    PT Start Time 1100    PT Stop Time 1150    PT Time Calculation (min) 50 min    Activity Tolerance Patient limited by pain    Behavior During Therapy WFL for tasks assessed/performed             Past Medical History:  Diagnosis Date   Anxiety    Enlarged prostate    GERD (gastroesophageal reflux disease)    Hepatitis    Hep B   Past Surgical History:  Procedure Laterality Date   CERVICAL DISCECTOMY     KNEE ARTHROSCOPY     LIPOMA EXCISION     neck/skull   LUMBAR [place] SURGERY     TONSILLECTOMY     Patient Active Problem List   Diagnosis Date Noted   GERD 12/18/2007   BENIGN PROSTATIC HYPERTROPHY, HX OF 12/18/2007   HEPATITIS B, CHRONIC 10/30/2007   COLONIC POLYPS 10/30/2007   ANXIETY 10/30/2007   FATTY LIVER DISEASE 10/30/2007   HEPATITIS B 10/16/2007    REFERRING DIAG: Z98.890 (ICD-10-CM) - Other specified postprocedural states M54.16 (ICD-10-CM) - Radiculopathy, lumbar region   THERAPY DIAG:  Other low back pain  Muscle weakness (generalized)  Abnormal posture  Rationale for Evaluation and Treatment Rehabilitation  PERTINENT HISTORY: Depression, Gastroesophageal reflux disease without esophagitis, Left L4-5 far lateral discectomy 08/10/2021, Hepatitis B   PRECAUTIONS: Back  SUBJECTIVE: Troy Shelton notes some days are more painful than others.  Sciatic, gluteal and back pain is significant today.  He is doing his home exercises.  PAIN:  Are you having pain? Yes: NPRS scale: 2-9/10 Pain location: Low back, L gluteal and L leg to  the calf Pain description: Ache, sharp, spasm Aggravating factors: Bending and twisting, prolonged postures Relieving factors: Change of position, ice   OBJECTIVE: (objective measures completed at initial evaluation unless otherwise dated) OBJECTIVE:    DIAGNOSTIC FINDINGS:  MRI in June: At L4-5 there is a left laminectomy. Large left paracentral/foraminal disc protrusion with mass effect on the left intraspinal nerve roots and contacting the exiting L4 nerve root.   PATIENT SURVEYS:  01/26/22: FOTO 42 (predicted 14)   SCREENING FOR RED FLAGS: Bowel or bladder incontinence: No Spinal tumors: No Cauda equina syndrome: No Compression fracture: No   COGNITION: Overall cognitive status: Within functional limits for tasks assessed                          SENSATION: WFL   MUSCLE LENGTH: 01/26/22: Bil hamstring tightness noted   POSTURE: rounded shoulders, forward head, and in sitting leaning to Rt side   PALPATION: 01/26/22: Lt QL trigger points noted   LUMBAR ROM:    Active  A/PROM  eval  Flexion Limited 25%  Extension WNL  Right lateral flexion WNL  Left lateral flexion WNL with pain at lower back  Right rotation WNL  Left rotation WNL   (Blank rows = not tested)   LOWER EXTREMITY MMT:     MMT Right eval Left  eval  Hip flexion 5/5 3+/5  Hip extension 5/5 3/5  Knee flexion 5/5 5/5  Knee extension 5/5 5/5  Ankle dorsiflexion 5/5 5/5   (Blank rows = not tested)   LUMBAR SPECIAL TESTS:  01/26/22: Straight leg raise test: Positive and Slump test: Positive   GAIT: Independent with amb   TODAY'S TREATMENT  02/09/2022 Lateral shift correction at the wall (shift to left) 10X 10 seconds Standing lumbar extension (hands on butt cheeks or elbows on wall) 10X 3 seconds within comfortable range Supine hamstrings stretch with other leg straight 4X 20 seconds B Prone press-ups with ice on low back for 5 minutes  Lumbar traction 15 minutes intermittent 60-120# (3  steps up and down)   01/26/22 See HEP - performed 3-5 reps of each exercise with reduction in pain after lateral shift and extension     PATIENT EDUCATION:  Education details: HEP Person educated: Patient Education method: Explanation, Demonstration, and Handouts Education comprehension: verbalized understanding, returned demonstration, and needs further education     HOME EXERCISE PROGRAM: Access Code: [email or web address] URL: [email or web address] Date: 01/26/2022 Prepared by: Faustino Congress   Exercises - Left Standing Lateral Shift Correction at Wall - Repetitions  - 3-5 x daily - 7 x weekly - 1 sets - 10 reps - 10 sec hold - Prone Press Up  - 3-5 x daily - 7 x weekly - 1 sets - 10 reps - 10 sec hold - Standing Lumbar Extension at Wall - Forearms  - 3-5 x daily - 7 x weekly - 1 sets - 10 reps - 10 sec hold - Supine 90/90 Sciatic Nerve Glide with Knee Flexion/Extension  - 2-3 x daily - 7 x weekly - 1 sets - 10 reps - 5-10 sec hold   ASSESSMENT:   CLINICAL IMPRESSION: We reviewed Troy Shelton's HEP with some correction required.  Started traction at 60-120# to try to reduce his HNP.  No problems reported immediately after traction through the end of the visit.  Continue extension emphasis to reduce peripheral symptoms.     OBJECTIVE IMPAIRMENTS difficulty walking, decreased ROM, decreased strength, increased fascial restrictions, increased muscle spasms, impaired flexibility, postural dysfunction, and pain.    ACTIVITY LIMITATIONS carrying, lifting, sitting, standing, squatting, sleeping, transfers, and locomotion level   PARTICIPATION LIMITATIONS: cleaning, driving, community activity, occupation, and yard work   PERSONAL FACTORS 3+ comorbidities: Depression, Gastroesophageal reflux disease without esophagitis, Left L4-5 far lateral discectomy 08/10/2021, Hepatitis B   are also affecting patient's functional outcome.    REHAB POTENTIAL: Good   CLINICAL DECISION MAKING:  Evolving/moderate complexity   EVALUATION COMPLEXITY: Moderate     GOALS: Goals reviewed with patient? Yes   SHORT TERM GOALS: Target date: 02/16/2022   Independent with initial HEP Goal status: On Going 02/10/2022     LONG TERM GOALS: Target date: 03/09/2022   Independent with final HEP Goal status: INITIAL   2.  FOTO score improved to 57 Goal status: INITIAL   3.  Lt hip strength improved to 4/5 for improved function and mobility Goal status: INIITAL   4.  Report pain < 3/10 with standing and sitting for improved function Goal status: INITIAL   5.  Demonstrate centralizatoin of symptoms for improved function Goal status: INITIAL       PLAN: PT FREQUENCY: 1x/week   PT DURATION: 6 weeks   PLANNED INTERVENTIONS: Therapeutic exercises, Therapeutic activity, Neuromuscular re-education, Balance training, Gait training, Patient/Family education, Aquatic Therapy, Dry Needling, Electrical stimulation, Spinal manipulation, Spinal mobilization, Cryotherapy,  Moist heat, Taping, Traction, Manual therapy, and Re-evaluation.   PLAN FOR NEXT SESSION: Check on traction results, review HEP, progress as able   Farley Ly, PT, MPT 02/10/2022, 5:26 PM

## 2022-02-17 ENCOUNTER — Encounter: Payer: Self-pay | Admitting: Physical Therapy

## 2022-02-17 ENCOUNTER — Ambulatory Visit (INDEPENDENT_AMBULATORY_CARE_PROVIDER_SITE_OTHER): Payer: 59 | Admitting: Physical Therapy

## 2022-02-17 DIAGNOSIS — M6281 Muscle weakness (generalized): Secondary | ICD-10-CM

## 2022-02-17 DIAGNOSIS — M5459 Other low back pain: Secondary | ICD-10-CM

## 2022-02-17 DIAGNOSIS — R293 Abnormal posture: Secondary | ICD-10-CM

## 2022-02-17 NOTE — Therapy (Signed)
OUTPATIENT PHYSICAL THERAPY TREATMENT NOTE   Patient Name: Troy Shelton MRN: [id number] DOB:[birth year], 56 y.o., male Today's Date: 02/17/2022  PCP: Lavone Orn, MD  REFERRING PROVIDER: Meade Maw, MD  END OF SESSION:   PT End of Session - 02/17/22 1015     Visit Number 3    Number of Visits 6    Date for PT Re-Evaluation 03/09/22    Authorization Type [employer] - medical necessity needed after 12 visits    PT Start Time 1017    PT Stop Time 1100    PT Time Calculation (min) 43 min    Activity Tolerance Patient limited by pain    Behavior During Therapy WFL for tasks assessed/performed             Past Medical History:  Diagnosis Date   Anxiety    Enlarged prostate    GERD (gastroesophageal reflux disease)    Hepatitis    Hep B   Past Surgical History:  Procedure Laterality Date   CERVICAL DISCECTOMY     KNEE ARTHROSCOPY     LIPOMA EXCISION     neck/skull   LUMBAR [place] SURGERY     TONSILLECTOMY     Patient Active Problem List   Diagnosis Date Noted   GERD 12/18/2007   BENIGN PROSTATIC HYPERTROPHY, HX OF 12/18/2007   HEPATITIS B, CHRONIC 10/30/2007   COLONIC POLYPS 10/30/2007   ANXIETY 10/30/2007   FATTY LIVER DISEASE 10/30/2007   HEPATITIS B 10/16/2007    REFERRING DIAG: Z98.890 (ICD-10-CM) - Other specified postprocedural states M54.16 (ICD-10-CM) - Radiculopathy, lumbar region   THERAPY DIAG:  Other low back pain  Muscle weakness (generalized)  Abnormal posture  Rationale for Evaluation and Treatment Rehabilitation  PERTINENT HISTORY: Depression, Gastroesophageal reflux disease without esophagitis, Left L4-5 far lateral discectomy 08/10/2021, Hepatitis B   PRECAUTIONS: Back  SUBJECTIVE: [name] relays he still has good days and bad days with pain, he is limping upon arrival. He relays he had more pain overall the day of and the day after the mechanical traction.  PAIN:  Are you having pain? Yes: NPRS scale: 2-9/10 Pain  location: Low back, L gluteal and L leg to the calf Pain description: Ache, sharp, spasm Aggravating factors: Bending and twisting, prolonged postures Relieving factors: Change of position, ice   OBJECTIVE: (objective measures completed at initial evaluation unless otherwise dated) OBJECTIVE:    DIAGNOSTIC FINDINGS:  MRI in June: At L4-5 there is a left laminectomy. Large left paracentral/foraminal disc protrusion with mass effect on the left intraspinal nerve roots and contacting the exiting L4 nerve root.   PATIENT SURVEYS:  01/26/22: FOTO 42 (predicted 62)   SCREENING FOR RED FLAGS: Bowel or bladder incontinence: No Spinal tumors: No Cauda equina syndrome: No Compression fracture: No   COGNITION: Overall cognitive status: Within functional limits for tasks assessed                          SENSATION: [facility]   MUSCLE LENGTH: 01/26/22: Bil hamstring tightness noted   POSTURE: rounded shoulders, forward head, and in sitting leaning to Rt side   PALPATION: 01/26/22: Lt QL trigger points noted   LUMBAR ROM:    Active  A/PROM  eval  Flexion Limited 25%  Extension WNL  Right lateral flexion WNL  Left lateral flexion WNL with pain at lower back  Right rotation WNL  Left rotation WNL   (Blank rows = not tested)   LOWER EXTREMITY  MMT:     MMT Right eval Left eval  Hip flexion 5/5 3+/5  Hip extension 5/5 3/5  Knee flexion 5/5 5/5  Knee extension 5/5 5/5  Ankle dorsiflexion 5/5 5/5   (Blank rows = not tested)   LUMBAR SPECIAL TESTS:  01/26/22: Straight leg raise test: Positive and Slump test: Positive   GAIT: Independent with amb   TODAY'S TREATMENT  02/17/22 -Lateral shift correction at the wall (shift to left) 2X10 holding 5 seconds -Standing lumbar extensions 2X10 holding 5 sec -Prone on elbows with hips offset to left 2 minute stretch -Prone press ups 2X10 holding 5 sec with hips offset to left -Quadriped alternating leg extensions 5sec hold X10 -Supine  piriformis stretch 30 sec X 2 figure 4, then 30 sec X 2 knee to opposite shoulder -Supine bridges 10 sec X10 -sciatic nerve glide on left 5 sec X 10  -Manual therapy: central PA mobs and unilat on left PA mobs L4-3 grade 2, lumbar rotational mobs in Rt sideling grade 3-4, left leg long axis distraction grade 3-5.   02/09/2022 Lateral shift correction at the wall (shift to left) 10X 10 seconds Standing lumbar extension (hands on butt cheeks or elbows on wall) 10X 3 seconds within comfortable range Supine hamstrings stretch with other leg straight 4X 20 seconds B Prone press-ups with ice on low back for 5 minutes  Lumbar traction 15 minutes intermittent 60-120# (3 steps up and down)   01/26/22 See HEP - performed 3-5 reps of each exercise with reduction in pain after lateral shift and extension     PATIENT EDUCATION:  Education details: HEP Person educated: Patient Education method: Explanation, Demonstration, and Handouts Education comprehension: verbalized understanding, returned demonstration, and needs further education     HOME EXERCISE PROGRAM: Access Code: [email or web address] URL: [email or web address] Date: 01/26/2022 Prepared by: Faustino Congress   Exercises - Left Standing Lateral Shift Correction at Wall - Repetitions  - 3-5 x daily - 7 x weekly - 1 sets - 10 reps - 10 sec hold - Prone Press Up  - 3-5 x daily - 7 x weekly - 1 sets - 10 reps - 10 sec hold - Standing Lumbar Extension at Wall - Forearms  - 3-5 x daily - 7 x weekly - 1 sets - 10 reps - 10 sec hold - Supine 90/90 Sciatic Nerve Glide with Knee Flexion/Extension  - 2-3 x daily - 7 x weekly - 1 sets - 10 reps - 5-10 sec hold   ASSESSMENT:   CLINICAL IMPRESSION: I held off on the traction today as he had reports of increased pain following this last time however this has resolved now so may still be an intervention in the future but would recommend less weight to start. I did perform lumbar manual therapy  for mobilizations and long axis distraction but seemed to help along with the lateral shift correction and repeated extensions. He did not have limp upon leaving and I reviewed HEP with him.      OBJECTIVE IMPAIRMENTS difficulty walking, decreased ROM, decreased strength, increased fascial restrictions, increased muscle spasms, impaired flexibility, postural dysfunction, and pain.    ACTIVITY LIMITATIONS carrying, lifting, sitting, standing, squatting, sleeping, transfers, and locomotion level   PARTICIPATION LIMITATIONS: cleaning, driving, community activity, occupation, and yard work   PERSONAL FACTORS 3+ comorbidities: Depression, Gastroesophageal reflux disease without esophagitis, Left L4-5 far lateral discectomy 08/10/2021, Hepatitis B   are also affecting patient's functional outcome.    REHAB POTENTIAL: Good   CLINICAL  DECISION MAKING: Evolving/moderate complexity   EVALUATION COMPLEXITY: Moderate     GOALS: Goals reviewed with patient? Yes   SHORT TERM GOALS: Target date: 02/16/2022   Independent with initial HEP Goal status: On Going 02/10/2022     LONG TERM GOALS: Target date: 03/09/2022   Independent with final HEP Goal status: INITIAL   2.  FOTO score improved to 57 Goal status: INITIAL   3.  Lt hip strength improved to 4/5 for improved function and mobility Goal status: INIITAL   4.  Report pain < 3/10 with standing and sitting for improved function Goal status: INITIAL   5.  Demonstrate centralizatoin of symptoms for improved function Goal status: INITIAL       PLAN: PT FREQUENCY: 1x/week   PT DURATION: 6 weeks   PLANNED INTERVENTIONS: Therapeutic exercises, Therapeutic activity, Neuromuscular re-education, Balance training, Gait training, Patient/Family education, Aquatic Therapy, Dry Needling, Electrical stimulation, Spinal manipulation, Spinal mobilization, Cryotherapy, Moist heat, Taping, Traction, Manual therapy, and Re-evaluation.   PLAN FOR NEXT  SESSION: how is HEP going?   Debbe Odea, PT, DPT 02/17/2022, 11:08 AM

## 2022-02-24 ENCOUNTER — Encounter: Payer: Self-pay | Admitting: Rehabilitative and Restorative Service Providers"

## 2022-02-24 ENCOUNTER — Ambulatory Visit (INDEPENDENT_AMBULATORY_CARE_PROVIDER_SITE_OTHER): Payer: 59 | Admitting: Rehabilitative and Restorative Service Providers"

## 2022-02-24 DIAGNOSIS — M5459 Other low back pain: Secondary | ICD-10-CM | POA: Diagnosis not present

## 2022-02-24 DIAGNOSIS — R293 Abnormal posture: Secondary | ICD-10-CM

## 2022-02-24 DIAGNOSIS — M6281 Muscle weakness (generalized): Secondary | ICD-10-CM | POA: Diagnosis not present

## 2022-02-24 NOTE — Therapy (Signed)
OUTPATIENT PHYSICAL THERAPY TREATMENT NOTE   Patient Name: Troy Shelton MRN: [id number] DOB:1966-01-07, 56 y.o., male Today's Date: 02/24/2022  PCP: Lavone Orn, MD  REFERRING PROVIDER: Meade Maw, MD  END OF SESSION:   PT End of Session - 02/24/22 1117     Visit Number 4    Number of Visits 6    Date for PT Re-Evaluation 03/09/22    Authorization Type [employer] - medical necessity needed after 12 visits    PT Start Time 1101    PT Stop Time 1142    PT Time Calculation (min) 41 min    Activity Tolerance Patient tolerated treatment well    Behavior During Therapy WFL for tasks assessed/performed              Past Medical History:  Diagnosis Date   Anxiety    Enlarged prostate    GERD (gastroesophageal reflux disease)    Hepatitis    Hep B   Past Surgical History:  Procedure Laterality Date   CERVICAL DISCECTOMY     KNEE ARTHROSCOPY     LIPOMA EXCISION     neck/skull   LUMBAR [place] SURGERY     TONSILLECTOMY     Patient Active Problem List   Diagnosis Date Noted   GERD 12/18/2007   BENIGN PROSTATIC HYPERTROPHY, HX OF 12/18/2007   HEPATITIS B, CHRONIC 10/30/2007   COLONIC POLYPS 10/30/2007   ANXIETY 10/30/2007   FATTY LIVER DISEASE 10/30/2007   HEPATITIS B 10/16/2007    REFERRING DIAG: Z98.890 (ICD-10-CM) - Other specified postprocedural states M54.16 (ICD-10-CM) - Radiculopathy, lumbar region   THERAPY DIAG:  Other low back pain  Muscle weakness (generalized)  Abnormal posture  Rationale for Evaluation and Treatment Rehabilitation  PERTINENT HISTORY: Depression, Gastroesophageal reflux disease without esophagitis, Left L4-5 far lateral discectomy 08/10/2021, Hepatitis B   PRECAUTIONS: Back  SUBJECTIVE: Troy Shelton reports symptoms are now more gluteal and low back (were more peripheral).  He reports better postural awareness and that he has been paying close attention to his body mechanics.  PAIN:  Are you having pain? Yes: NPRS  scale: 2-9/10 Pain location: Low back, L gluteal, no longer L leg to the calf Pain description: Ache, sharp, spasm Aggravating factors: Bending and twisting, prolonged postures Relieving factors: Change of position, ice   OBJECTIVE: (objective measures completed at initial evaluation unless otherwise dated) OBJECTIVE:    DIAGNOSTIC FINDINGS:  MRI in June: At L4-5 there is a left laminectomy. Large left paracentral/foraminal disc protrusion with mass effect on the left intraspinal nerve roots and contacting the exiting L4 nerve root.   PATIENT SURVEYS:  01/26/22: FOTO 42 (predicted 15)   SCREENING FOR RED FLAGS: Bowel or bladder incontinence: No Spinal tumors: No Cauda equina syndrome: No Compression fracture: No   COGNITION: Overall cognitive status: Within functional limits for tasks assessed                          SENSATION: [facility]   MUSCLE LENGTH: 01/26/22: Bil hamstring tightness noted   POSTURE: rounded shoulders, forward head, and in sitting leaning to Rt side   PALPATION: 01/26/22: Lt QL trigger points noted   LUMBAR ROM:    Active  A/PROM  eval  Flexion Limited 25%  Extension WNL  Right lateral flexion WNL  Left lateral flexion WNL with pain at lower back  Right rotation WNL  Left rotation WNL   (Blank rows = not tested)   LOWER EXTREMITY MMT:  MMT Right eval Left eval  Hip flexion 5/5 3+/5  Hip extension 5/5 3/5  Knee flexion 5/5 5/5  Knee extension 5/5 5/5  Ankle dorsiflexion 5/5 5/5   (Blank rows = not tested)   LUMBAR SPECIAL TESTS:  01/26/22: Straight leg raise test: Positive and Slump test: Positive   GAIT: Independent with amb   TODAY'S TREATMENT  02/24/2022 Standing lumbar extension (hands on butt cheeks) 10X 3 seconds within comfortable range Supine hamstrings stretch with other leg straight 4X 20 seconds B Prone alternating hip extensions 10X 3 seconds Prone alternating arm and leg extensions 10X 3 seconds Hip hike in door way 2  sets of 5 for 3 seconds   Functional Activities: Review of disc pressures in various position, practical log roll and bed mobility, practice getting in and out of a vehicle (required for work, [employer] work) posture and the importance of changing positions frequently, review of spine anatomy   02/17/22 -Lateral shift correction at the wall (shift to left) 2X10 holding 5 seconds -Standing lumbar extensions 2X10 holding 5 sec -Prone on elbows with hips offset to left 2 minute stretch -Prone press ups 2X10 holding 5 sec with hips offset to left -Quadriped alternating leg extensions 5sec hold X10 -Supine piriformis stretch 30 sec X 2 figure 4, then 30 sec X 2 knee to opposite shoulder -Supine bridges 10 sec X10 -sciatic nerve glide on left 5 sec X 10  -Manual therapy: central PA mobs and unilat on left PA mobs L4-3 grade 2, lumbar rotational mobs in Rt sideling grade 3-4, left leg long axis distraction grade 3-5.   02/09/2022 Lateral shift correction at the wall (shift to left) 10X 10 seconds Standing lumbar extension (hands on butt cheeks or elbows on wall) 10X 3 seconds within comfortable range Supine hamstrings stretch with other leg straight 4X 20 seconds B Prone press-ups with ice on low back for 5 minutes  Lumbar traction 15 minutes intermittent 60-120# (3 steps up and down)     PATIENT EDUCATION:  Education details: HEP Person educated: Patient Education method: Consulting civil engineer, Demonstration, and Handouts Education comprehension: verbalized understanding, returned demonstration, and needs further education     HOME EXERCISE PROGRAM: Access Code: [email or web address] URL: [email or web address] Date: 02/24/2022 Prepared by: Vista Mink  Exercises - Left Standing Lateral Shift Correction at Wall - Repetitions  - 3-5 x daily - 1 x weekly - 1 sets - 10 reps - 10 sec hold - Prone Press Up  - 3-5 x daily - 7 x weekly - 1 sets - 10 reps - 10 sec hold - Standing Lumbar  Extension at Wall - Forearms  - 3-5 x daily - 7 x weekly - 1 sets - 10 reps - 10 sec hold - Supine 90/90 Sciatic Nerve Glide with Knee Flexion/Extension  - 2-3 x daily - 1 x weekly - 1 sets - 10 reps - 5-10 sec hold - Supine Hamstring Stretch  - 2-3 x daily - 7 x weekly - 1 sets - 5 reps - 20 seconds hold - Prone Hip Extension  - 1 x daily - 7 x weekly - 1-2 sets - 10 reps - 3-10 seconds hold - Prone Alternating Arm and Leg Lifts  - 1 x daily - 7 x weekly - 1-2 sets - 10 reps - 3-10 seconds hold - Standing Hip Hiking  - 2 x daily - 7 x weekly - 2 sets - 5 reps - 3 seconds hold    ASSESSMENT:   CLINICAL IMPRESSION:  Troy Shelton notes no peripheral symptoms below the gluteal fold since his traction visit.  He did have an increase in back pain after that visit, but it has calmed down since then.  Given that his symptoms have been moving proximal, we focused more on low back strength and stability today along with more education and practical work.  Troy Shelton was receptive to feedback and has been doing a good job with postural awareness and is improving with body mechanics.  Continue POC.    OBJECTIVE IMPAIRMENTS difficulty walking, decreased ROM, decreased strength, increased fascial restrictions, increased muscle spasms, impaired flexibility, postural dysfunction, and pain.    ACTIVITY LIMITATIONS carrying, lifting, sitting, standing, squatting, sleeping, transfers, and locomotion level   PARTICIPATION LIMITATIONS: cleaning, driving, community activity, occupation, and yard work   PERSONAL FACTORS 3+ comorbidities: Depression, Gastroesophageal reflux disease without esophagitis, Left L4-5 far lateral discectomy 08/10/2021, Hepatitis B   are also affecting patient's functional outcome.    REHAB POTENTIAL: Good   CLINICAL DECISION MAKING: Evolving/moderate complexity   EVALUATION COMPLEXITY: Moderate     GOALS: Goals reviewed with patient? Yes   SHORT TERM GOALS: Target date: 02/16/2022    Independent with initial HEP Goal status: Met 02/24/2022     LONG TERM GOALS: Target date: 03/09/2022   Independent with final HEP Goal status: INITIAL   2.  FOTO score improved to 57 Goal status: INITIAL   3.  Lt hip strength improved to 4/5 for improved function and mobility Goal status: INIITAL   4.  Report pain < 3/10 with standing and sitting for improved function Goal status: INITIAL   5.  Demonstrate centralizatoin of symptoms for improved function Goal status: On Going 02/24/2022       PLAN: PT FREQUENCY: 1x/week   PT DURATION: 6 weeks   PLANNED INTERVENTIONS: Therapeutic exercises, Therapeutic activity, Neuromuscular re-education, Balance training, Gait training, Patient/Family education, Aquatic Therapy, Dry Needling, Electrical stimulation, Spinal manipulation, Spinal mobilization, Cryotherapy, Moist heat, Taping, Traction, Manual therapy, and Re-evaluation.   PLAN FOR NEXT SESSION: Progress erector spinae, quadratus lumborum and hip abductors strength, practical body mechanics work.     Farley Ly, PT, MPT 02/24/2022, 2:13 PM

## 2022-02-26 ENCOUNTER — Telehealth: Payer: Self-pay

## 2022-02-26 NOTE — Telephone Encounter (Signed)
Refill request we have gotten from [facility] for Gabapentin '100mg'$  take by mouth tid

## 2022-03-01 ENCOUNTER — Other Ambulatory Visit: Payer: Self-pay | Admitting: Neurosurgery

## 2022-03-01 ENCOUNTER — Encounter (HOSPITAL_BASED_OUTPATIENT_CLINIC_OR_DEPARTMENT_OTHER): Payer: Self-pay

## 2022-03-01 ENCOUNTER — Other Ambulatory Visit (HOSPITAL_BASED_OUTPATIENT_CLINIC_OR_DEPARTMENT_OTHER): Payer: Self-pay

## 2022-03-01 MED ORDER — GABAPENTIN 100 MG PO CAPS
100.0000 mg | ORAL_CAPSULE | Freq: Three times a day (TID) | ORAL | 2 refills | Status: DC
  Filled 2022-03-01 (×2): qty 90, 30d supply, fill #0

## 2022-03-02 ENCOUNTER — Other Ambulatory Visit (HOSPITAL_BASED_OUTPATIENT_CLINIC_OR_DEPARTMENT_OTHER): Payer: Self-pay

## 2022-03-03 ENCOUNTER — Ambulatory Visit (INDEPENDENT_AMBULATORY_CARE_PROVIDER_SITE_OTHER): Payer: 59 | Admitting: Physical Therapy

## 2022-03-03 ENCOUNTER — Encounter: Payer: Self-pay | Admitting: Physical Therapy

## 2022-03-03 DIAGNOSIS — R293 Abnormal posture: Secondary | ICD-10-CM

## 2022-03-03 DIAGNOSIS — M5459 Other low back pain: Secondary | ICD-10-CM | POA: Diagnosis not present

## 2022-03-03 DIAGNOSIS — M6281 Muscle weakness (generalized): Secondary | ICD-10-CM | POA: Diagnosis not present

## 2022-03-03 NOTE — Therapy (Addendum)
OUTPATIENT PHYSICAL THERAPY TREATMENT/DISCHARGE NOTE   Patient Name: Troy Shelton MRN: [id number] DOB:[birth year], 56 y.o., male Today's Date: 03/03/2022  PCP: Lavone Orn, MD  REFERRING PROVIDER: Meade Maw, MD  PHYSICAL THERAPY DISCHARGE SUMMARY  Visits from Start of Care: 5  Current functional level related to goals / functional outcomes: See note   Remaining deficits: See note   Education / Equipment: HEP   Patient agrees to discharge. Patient goals were partially met. Patient is being discharged due to not returning since the last visit.    END OF SESSION:   PT End of Session - 03/03/22 1025     Visit Number 5    Number of Visits 6    Date for PT Re-Evaluation 03/09/22    Authorization Type [employer] - medical necessity needed after 12 visits    PT Start Time 1015    PT Stop Time 1100    PT Time Calculation (min) 45 min    Activity Tolerance Patient tolerated treatment well    Behavior During Therapy WFL for tasks assessed/performed              Past Medical History:  Diagnosis Date   Anxiety    Enlarged prostate    GERD (gastroesophageal reflux disease)    Hepatitis    Hep B   Past Surgical History:  Procedure Laterality Date   CERVICAL DISCECTOMY     KNEE ARTHROSCOPY     LIPOMA EXCISION     neck/skull   LUMBAR [place] SURGERY     TONSILLECTOMY     Patient Active Problem List   Diagnosis Date Noted   GERD 12/18/2007   BENIGN PROSTATIC HYPERTROPHY, HX OF 12/18/2007   HEPATITIS B, CHRONIC 10/30/2007   COLONIC POLYPS 10/30/2007   ANXIETY 10/30/2007   FATTY LIVER DISEASE 10/30/2007   HEPATITIS B 10/16/2007    REFERRING DIAG: Z98.890 (ICD-10-CM) - Other specified postprocedural states M54.16 (ICD-10-CM) - Radiculopathy, lumbar region   THERAPY DIAG:  Other low back pain  Muscle weakness (generalized)  Abnormal posture  Rationale for Evaluation and Treatment Rehabilitation  PERTINENT HISTORY: Depression,  Gastroesophageal reflux disease without esophagitis, Left L4-5 far lateral discectomy 08/10/2021, Hepatitis B   PRECAUTIONS: Back  SUBJECTIVE: [name] reports he is having a bad day with pain, not sure why but may be from driving more with getting in and out of vehicle for meetings. He does however feel he is getting more good days than bad days with his pain.  PAIN:  Are you having pain? Yes: NPRS scale: 7/10 Pain location: Low back, L gluteal, no longer L leg to the calf Pain description: Ache, sharp, spasm Aggravating factors: Bending and twisting, prolonged postures Relieving factors: Change of position, ice   OBJECTIVE: (objective measures completed at initial evaluation unless otherwise dated) OBJECTIVE:    DIAGNOSTIC FINDINGS:  MRI in June: At L4-5 there is a left laminectomy. Large left paracentral/foraminal disc protrusion with mass effect on the left intraspinal nerve roots and contacting the exiting L4 nerve root.   PATIENT SURVEYS:  01/26/22: FOTO 42 (predicted 59)   SCREENING FOR RED FLAGS: Bowel or bladder incontinence: No Spinal tumors: No Cauda equina syndrome: No Compression fracture: No   COGNITION: Overall cognitive status: Within functional limits for tasks assessed                          SENSATION: [facility]   MUSCLE LENGTH: 01/26/22: Bil hamstring tightness noted   POSTURE:  rounded shoulders, forward head, and in sitting leaning to Rt side   PALPATION: 01/26/22: Lt QL trigger points noted   LUMBAR ROM:    Active  A/PROM  eval  Flexion Limited 25%  Extension WNL  Right lateral flexion WNL  Left lateral flexion WNL with pain at lower back  Right rotation WNL  Left rotation WNL   (Blank rows = not tested)   LOWER EXTREMITY MMT:     MMT Right eval Left eval  Hip flexion 5/5 3+/5  Hip extension 5/5 3/5  Knee flexion 5/5 5/5  Knee extension 5/5 5/5  Ankle dorsiflexion 5/5 5/5   (Blank rows = not tested)   LUMBAR SPECIAL TESTS:  01/26/22:  Straight leg raise test: Positive and Slump test: Positive   GAIT: Independent with amb   TODAY'S TREATMENT  03/03/22 -Lumbar mechanical traction 15 minutes intermittent 75-100# (3 steps up and down) -Lateral shift correction X10 holding 5 sec -Standing lumbar extensions 2X 10 holding 5 sec -Lumbar rotational manipulation with + cavitation noted in right sidelying, unsucessful cavitation on left   02/24/2022 Standing lumbar extension (hands on butt cheeks) 10X 3 seconds within comfortable range Supine hamstrings stretch with other leg straight 4X 20 seconds B Prone alternating hip extensions 10X 3 seconds Prone alternating arm and leg extensions 10X 3 seconds Hip hike in door way 2 sets of 5 for 3 seconds   Functional Activities: Review of disc pressures in various position, practical log roll and bed mobility, practice getting in and out of a vehicle (required for work, [employer] fast [employer] work) posture and the importance of changing positions frequently, review of spine anatomy   02/17/22 -Lateral shift correction at the wall (shift to left) 2X10 holding 5 seconds -Standing lumbar extensions 2X10 holding 5 sec -Prone on elbows with hips offset to left 2 minute stretch -Prone press ups 2X10 holding 5 sec with hips offset to left -Quadriped alternating leg extensions 5sec hold X10 -Supine piriformis stretch 30 sec X 2 figure 4, then 30 sec X 2 knee to opposite shoulder -Supine bridges 10 sec X10 -sciatic nerve glide on left 5 sec X 10  -Manual therapy: central PA mobs and unilat on left PA mobs L4-3 grade 2, lumbar rotational mobs in Rt sideling grade 3-4, left leg long axis distraction grade 3-5.      PATIENT EDUCATION:  Education details: HEP Person educated: Patient Education method: Explanation, Media planner, and Handouts Education comprehension: verbalized understanding, returned demonstration, and needs further education     HOME EXERCISE PROGRAM: Access Code: [email or web address] URL:  [email or web address] Date: 02/24/2022 Prepared by: Vista Mink  Exercises - Left Standing Lateral Shift Correction at Wall - Repetitions  - 3-5 x daily - 1 x weekly - 1 sets - 10 reps - 10 sec hold - Prone Press Up  - 3-5 x daily - 7 x weekly - 1 sets - 10 reps - 10 sec hold - Standing Lumbar Extension at Wall - Forearms  - 3-5 x daily - 7 x weekly - 1 sets - 10 reps - 10 sec hold - Supine 90/90 Sciatic Nerve Glide with Knee Flexion/Extension  - 2-3 x daily - 1 x weekly - 1 sets - 10 reps - 5-10 sec hold - Supine Hamstring Stretch  - 2-3 x daily - 7 x weekly - 1 sets - 5 reps - 20 seconds hold - Prone Hip Extension  - 1 x daily - 7 x weekly - 1-2 sets - 10 reps - 3-10 seconds  hold - Prone Alternating Arm and Leg Lifts  - 1 x daily - 7 x weekly - 1-2 sets - 10 reps - 3-10 seconds hold - Standing Hip Hiking  - 2 x daily - 7 x weekly - 2 sets - 5 reps - 3 seconds hold    ASSESSMENT:   CLINICAL IMPRESSION: He appears to be progressing well with PT and is having centralization of symptoms. Today he had more pain however it was not down his leg. I did perform interventions of mechanical lumbar traction today and lumbar manipulation and this gave him good pain relief today. I encouraged him to continue with mckenzie PT extension exercises and to set up more PT if he feels he needs to. He will trial HEP for next 2 weeks and meet with MD then if PT is determined that it needs to continue he can return at that time.     OBJECTIVE IMPAIRMENTS difficulty walking, decreased ROM, decreased strength, increased fascial restrictions, increased muscle spasms, impaired flexibility, postural dysfunction, and pain.    ACTIVITY LIMITATIONS carrying, lifting, sitting, standing, squatting, sleeping, transfers, and locomotion level   PARTICIPATION LIMITATIONS: cleaning, driving, community activity, occupation, and yard work   PERSONAL FACTORS 3+ comorbidities: Depression, Gastroesophageal reflux  disease without esophagitis, Left L4-5 far lateral discectomy 08/10/2021, Hepatitis B   are also affecting patient's functional outcome.    REHAB POTENTIAL: Good   CLINICAL DECISION MAKING: Evolving/moderate complexity   EVALUATION COMPLEXITY: Moderate     GOALS: Goals reviewed with patient? Yes   SHORT TERM GOALS: Target date: 02/16/2022   Independent with initial HEP Goal status: Met 02/24/2022     LONG TERM GOALS: Target date: 03/09/2022   Independent with final HEP Goal status: INITIAL   2.  FOTO score improved to 57 Goal status: INITIAL   3.  Lt hip strength improved to 4/5 for improved function and mobility Goal status: INIITAL   4.  Report pain < 3/10 with standing and sitting for improved function Goal status: INITIAL   5.  Demonstrate centralizatoin of symptoms for improved function Goal status: On Going 02/24/2022       PLAN: PT FREQUENCY: 1x/week   PT DURATION: 6 weeks   PLANNED INTERVENTIONS: Therapeutic exercises, Therapeutic activity, Neuromuscular re-education, Balance training, Gait training, Patient/Family education, Aquatic Therapy, Dry Needling, Electrical stimulation, Spinal manipulation, Spinal mobilization, Cryotherapy, Moist heat, Taping, Traction, Manual therapy, and Re-evaluation.   PLAN FOR NEXT SESSION:  He is going to trial HEP and follow up with MD, he will return if needed.  Debbe Odea, PT, DPT 03/03/2022, 11:16 AM  Farley Ly PT, MPT

## 2022-03-18 ENCOUNTER — Encounter: Payer: Self-pay | Admitting: Neurosurgery

## 2022-03-18 ENCOUNTER — Ambulatory Visit (INDEPENDENT_AMBULATORY_CARE_PROVIDER_SITE_OTHER): Payer: 59 | Admitting: Neurosurgery

## 2022-03-18 VITALS — BP 128/82 | Ht 73.0 in | Wt 222.0 lb

## 2022-03-18 DIAGNOSIS — M5416 Radiculopathy, lumbar region: Secondary | ICD-10-CM | POA: Diagnosis not present

## 2022-03-18 NOTE — Progress Notes (Signed)
   DOS: 08/10/21 left L4-5 far lateral discectomy with Dr. Izora Ribas  HISTORY OF PRESENT ILLNESS: 03/18/2022 Mr. Abrahm Mancia is status post lateral discectomy.  He had a recurrent disc herniation in the postoperative period, but is currently doing well.  Physical therapy really helped.Marland Kitchen   PHYSICAL EXAMINATION:   Vitals:   03/18/22 1141  BP: 128/82   General: Patient is well developed, well nourished, calm, collected, and in no apparent distress.  NEUROLOGICAL:  General: In no acute distress.  Awake, alert, oriented to person, place, and time. Pupils equal round and reactive to light.   Strength:  Side Iliopsoas Quads Hamstring PF DF EHL  R '5 5 5 5 5 5  '$ L '5 5 5 5 5 5   '$ Incision c/d/i   ROS (Neurologic): Negative except as noted above  IMAGING: MRI from June 6,23 was reviewed with him.  ASSESSMENT/PLAN:  [place] is doing well after far lateral discectomy.  He did suffer a recurrent disc herniation, but is currently doing well.  At this point, we will see him back on an as-needed basis.  If he has recurrence of pain, we will reevaluate at that time.  I spent a total of 15 minutes in face-to-face and non-face-to-face activities related to this patient's care today.   Meade Maw MD, [facility] Department of Neurosurgery

## 2022-07-01 ENCOUNTER — Ambulatory Visit (INDEPENDENT_AMBULATORY_CARE_PROVIDER_SITE_OTHER): Payer: 59 | Admitting: Neurosurgery

## 2022-07-01 ENCOUNTER — Ambulatory Visit: Payer: 59 | Admitting: Neurosurgery

## 2022-07-01 ENCOUNTER — Encounter: Payer: Self-pay | Admitting: Neurosurgery

## 2022-07-01 VITALS — BP 138/84 | Ht 73.0 in | Wt 224.0 lb

## 2022-07-01 DIAGNOSIS — M5442 Lumbago with sciatica, left side: Secondary | ICD-10-CM | POA: Diagnosis not present

## 2022-07-01 DIAGNOSIS — M5126 Other intervertebral disc displacement, lumbar region: Secondary | ICD-10-CM

## 2022-07-01 DIAGNOSIS — G8929 Other chronic pain: Secondary | ICD-10-CM

## 2022-07-01 DIAGNOSIS — M5416 Radiculopathy, lumbar region: Secondary | ICD-10-CM

## 2022-07-01 NOTE — Patient Instructions (Signed)
Please see below for information in regards to your upcoming surgery:  Planned surgery: L4-5 lateral lumbar interbody fusion, L4-5 posterior spinal fusion, Left L4-5 decompression   Surgery date: 07/23/22 - you will find out your arrival time the business day before your surgery.   Pre-op appointment at [facility]: we will call you with a date/time for this. Pre-admit testing is located on the first floor of the [facility], [place], [address]. Please bring all prescriptions in the original prescription bottles to your appointment, even if you have reviewed medications by phone with a pharmacy representative. Pre-op labs may be done at your pre-op appointment. You are not required to fast for these labs. Should you need to change your pre-op appointment, please call Pre-admit testing at [phone].     NSAIDS (Non-steroidal anti-inflammatory drugs): because you are having a fusion, no NSAIDS (such as ibuprofen, aleve, naproxen, meloxicam, diclofenac) for 3 months after surgery. Celebrex is an exception. Tylenol is ok because it is not an NSAID.    Because you are having a fusion: for appointments after your 2 week follow-up: please arrive at the [facility] outpatient imaging center ([place], [place]) or [facility] one hour prior to your appointment for x-rays. This applies to every appointment after your 2 week follow-up. Failure to do so may result in your appointment being rescheduled.   If you have FMLA/disability paperwork, please drop it off or fax it to [phone], attention [place].   We can be reached by phone or mychart 8am-4pm, Monday-Friday. If you have any questions/concerns before or after surgery, you can reach [place] at [phone], or you can send a mychart message. If you have a concern after hours that cannot wait until normal business hours, you can call [phone] and ask to  page the neurosurgeon on call for [place].   Appointments/FMLA & disability paperwork: [place] Nurse: Ophelia Shoulder  Medical assistant: Raquel Sarna Physician Assistant's: Cooper Render & Geronimo Boot Surgeon: Meade Maw, MD

## 2022-07-01 NOTE — H&P (View-Only) (Signed)
DOS: 08/10/21 left L4-5 far lateral discectomy with Dr. Izora Ribas  HISTORY OF PRESENT ILLNESS: 07/01/2022 [name] returns to see me.  He has had worsening pain in his left leg over the past several months.  He previously tried physical therapy, but his pain has returned and he is now in desperate left back and left leg pain.  03/18/2022 Mr. Stacey Maura is status post lateral discectomy.  He had a recurrent disc herniation in the postoperative period, but is currently doing well.  Physical therapy really helped..   No family history on file. Social History   Socioeconomic History   Marital status: Married    Spouse name: Not on file   Number of children: Not on file   Years of education: Not on file   Highest education level: Not on file  Occupational History   Not on file  Tobacco Use   Smoking status: Never    Passive exposure: Never   Smokeless tobacco: Never  Vaping Use   Vaping Use: Never used  Substance and Sexual Activity   Alcohol use: Not Currently   Drug use: Never   Sexual activity: Not on file  Other Topics Concern   Not on file  Social History Narrative   Not on file   Social Determinants of Health   Financial Resource Strain: Not on file  Food Insecurity: Not on file  Transportation Needs: Not on file  Physical Activity: Not on file  Stress: Not on file  Social Connections: Not on file  Intimate Partner Violence: Not on file    Current Meds  Medication Sig   pantoprazole (PROTONIX) 40 MG tablet Take 40 mg by mouth in the morning.   sertraline (ZOLOFT) 100 MG tablet Take 100 mg by mouth in the morning.   tamsulosin (FLOMAX) 0.4 MG CAPS capsule Take 0.4 mg by mouth daily.   VEMLIDY 25 MG TABS Take 25 mg by mouth in the morning.   No Known Allergies   PHYSICAL EXAMINATION:   Vitals:   07/01/22 1347  BP: 138/84   General: Patient is well developed, well nourished, calm, collected, and in moderate apparent distress.  NEUROLOGICAL:   General: In no acute distress.  Awake, alert, oriented to person, place, and time. Pupils equal round and reactive to light.   Strength:  Side Iliopsoas Quads Hamstring PF DF EHL  R '5 5 5 5 5 4  '$ L '5 5 5 5 5 5   '$ Incision c/d/i   Straight leg raise is positive at 30 degrees on the left.  ROS (Neurologic): Negative except as noted above  IMAGING: MRI from June 6,23 was reviewed with him. L3-L4: Mild broad-based disc bulge. Mild bilateral facet arthropathy. Mild right subarticular recess narrowing. No foraminal stenosis. Mild spinal stenosis.   L4-L5: Left laminectomy. Large left paracentral/foraminal disc protrusion with mass effect on the left intraspinal nerve roots and contacting the exiting L4 nerve root. Mild left foraminal stenosis. No right foraminal stenosis. Moderate multifactorial spinal stenosis. Mild bilateral facet arthropathy.   L5-S1: No significant disc bulge. No neural foraminal stenosis. No central canal stenosis.   IMPRESSION: 1. At L4-5 there is a left laminectomy. Large left paracentral/foraminal disc protrusion with mass effect on the left intraspinal nerve roots and contacting the exiting L4 nerve root. Mild left foraminal stenosis. No right foraminal stenosis. Moderate multifactorial spinal stenosis. Mild bilateral facet arthropathy.     Electronically Signed   By: Kathreen Devoid M.D.   On: 01/07/2022 08:13  ASSESSMENT/PLAN:  Jos Cygan is doing poorly.  He has had return of his left sided radicular symptoms.  He is also having left-sided back pain with left-sided sciatica.  He has recurrent disc herniation at L4-5.  He has now had 2 recurrent disc herniations.  He underwent discectomies in 2019 2023 and had recurrent discectomies after each 1.  He is now tried physical therapy since surgery and has return of severe leg pain.  Given that he has had 2 different recurrent disc herniations, I do not feel that further discectomies are appropriate.   I have recommended surgical intervention that involves fixation.  His L4-5 disc is approximately 12 mm tall.  I am very concerned that a TLIF cage will not appropriately hold the disc base.  Thus, I have recommended a lateral approach for discectomy at L4-5 and placement of an interbody cage to provide anterior structural support followed by posterior fixation and decompression of the left-sided L5 nerve root.  I discussed the option of a transforaminal lumbar interbody fusion, but I recommended lateral approach due to the increased endplate coverage that a lateral interbody can provide, in addition to the increased number of footprint options to choose an optimal size.  I discussed the planned procedure at length with the patient, including the risks, benefits, alternatives, and indications. The risks discussed include but are not limited to bleeding, infection, need for reoperation, spinal fluid leak, stroke, vision loss, anesthetic complication, coma, paralysis, and even death. I also described the possibility of psoas weakness and paresthesias. I described in detail that improvement was not guaranteed.  The patient expressed understanding of these risks, and asked that we proceed with surgery. I described the surgery in layman's terms, and gave ample opportunity for questions, which were answered to the best of my ability.  I spent a total of 30 minutes in face-to-face and non-face-to-face activities related to this patient's care today.   Meade Maw MD, [facility] Department of Neurosurgery

## 2022-07-01 NOTE — Progress Notes (Signed)
DOS: 08/10/21 left L4-5 far lateral discectomy with Dr. Izora Ribas  HISTORY OF PRESENT ILLNESS: 07/01/2022 [name] returns to see me.  He has had worsening pain in his left leg over the past several months.  He previously tried physical therapy, but his pain has returned and he is now in desperate left back and left leg pain.  03/18/2022 [name] is status post lateral discectomy.  He had a recurrent disc herniation in the postoperative period, but is currently doing well.  Physical therapy really helped..   No family history on file. Social History   Socioeconomic History   Marital status: Married    Spouse name: Not on file   Number of children: Not on file   Years of education: Not on file   Highest education level: Not on file  Occupational History   Not on file  Tobacco Use   Smoking status: Never    Passive exposure: Never   Smokeless tobacco: Never  Vaping Use   Vaping Use: Never used  Substance and Sexual Activity   Alcohol use: Not Currently   Drug use: Never   Sexual activity: Not on file  Other Topics Concern   Not on file  Social History Narrative   Not on file   Social Determinants of Health   Financial Resource Strain: Not on file  Food Insecurity: Not on file  Transportation Needs: Not on file  Physical Activity: Not on file  Stress: Not on file  Social Connections: Not on file  Intimate Partner Violence: Not on file    Current Meds  Medication Sig   pantoprazole (PROTONIX) 40 MG tablet Take 40 mg by mouth in the morning.   sertraline (ZOLOFT) 100 MG tablet Take 100 mg by mouth in the morning.   tamsulosin (FLOMAX) 0.4 MG CAPS capsule Take 0.4 mg by mouth daily.   VEMLIDY 25 MG TABS Take 25 mg by mouth in the morning.   No Known Allergies   PHYSICAL EXAMINATION:   Vitals:   07/01/22 1347  BP: 138/84   General: Patient is well developed, well nourished, calm, collected, and in moderate apparent distress.  NEUROLOGICAL:   General: In no acute distress.  Awake, alert, oriented to person, place, and time. Pupils equal round and reactive to light.   Strength:  Side Iliopsoas Quads Hamstring PF DF EHL  R '5 5 5 5 5 4  '$ L '5 5 5 5 5 5   '$ Incision c/d/i   Straight leg raise is positive at 30 degrees on the left.  ROS (Neurologic): Negative except as noted above  IMAGING: MRI from June 6,23 was reviewed with him. L3-L4: Mild broad-based disc bulge. Mild bilateral facet arthropathy. Mild right subarticular recess narrowing. No foraminal stenosis. Mild spinal stenosis.   L4-L5: Left laminectomy. Large left paracentral/foraminal disc protrusion with mass effect on the left intraspinal nerve roots and contacting the exiting L4 nerve root. Mild left foraminal stenosis. No right foraminal stenosis. Moderate multifactorial spinal stenosis. Mild bilateral facet arthropathy.   L5-S1: No significant disc bulge. No neural foraminal stenosis. No central canal stenosis.   IMPRESSION: 1. At L4-5 there is a left laminectomy. Large left paracentral/foraminal disc protrusion with mass effect on the left intraspinal nerve roots and contacting the exiting L4 nerve root. Mild left foraminal stenosis. No right foraminal stenosis. Moderate multifactorial spinal stenosis. Mild bilateral facet arthropathy.     Electronically Signed   By: Kathreen Devoid M.D.   On: 01/07/2022 08:13  ASSESSMENT/PLAN:  Troy Shelton is doing poorly.  He has had return of his left sided radicular symptoms.  He is also having left-sided back pain with left-sided sciatica.  He has recurrent disc herniation at L4-5.  He has now had 2 recurrent disc herniations.  He underwent discectomies in 2019 2023 and had recurrent discectomies after each 1.  He is now tried physical therapy since surgery and has return of severe leg pain.  Given that he has had 2 different recurrent disc herniations, I do not feel that further discectomies are appropriate.   I have recommended surgical intervention that involves fixation.  His L4-5 disc is approximately 12 mm tall.  I am very concerned that a TLIF cage will not appropriately hold the disc base.  Thus, I have recommended a lateral approach for discectomy at L4-5 and placement of an interbody cage to provide anterior structural support followed by posterior fixation and decompression of the left-sided L5 nerve root.  I discussed the option of a transforaminal lumbar interbody fusion, but I recommended lateral approach due to the increased endplate coverage that a lateral interbody can provide, in addition to the increased number of footprint options to choose an optimal size.  I discussed the planned procedure at length with the patient, including the risks, benefits, alternatives, and indications. The risks discussed include but are not limited to bleeding, infection, need for reoperation, spinal fluid leak, stroke, vision loss, anesthetic complication, coma, paralysis, and even death. I also described the possibility of psoas weakness and paresthesias. I described in detail that improvement was not guaranteed.  The patient expressed understanding of these risks, and asked that we proceed with surgery. I described the surgery in layman's terms, and gave ample opportunity for questions, which were answered to the best of my ability.  I spent a total of 30 minutes in face-to-face and non-face-to-face activities related to this patient's care today.   Meade Maw MD, [facility] Department of Neurosurgery

## 2022-07-02 ENCOUNTER — Other Ambulatory Visit: Payer: Self-pay

## 2022-07-02 DIAGNOSIS — Z01818 Encounter for other preprocedural examination: Secondary | ICD-10-CM

## 2022-07-08 ENCOUNTER — Encounter: Payer: Self-pay | Admitting: Neurosurgery

## 2022-07-14 ENCOUNTER — Encounter: Payer: Self-pay | Admitting: Neurosurgery

## 2022-07-14 ENCOUNTER — Encounter
Admission: RE | Admit: 2022-07-14 | Discharge: 2022-07-14 | Disposition: A | Discharge status: Home / Self Care | Payer: 59 | Source: Ambulatory Visit | Attending: Neurosurgery | Admitting: Neurosurgery

## 2022-07-14 VITALS — BP 130/83 | HR 58 | Temp 97.7°F | Resp 12 | Ht 73.0 in | Wt 226.0 lb

## 2022-07-14 DIAGNOSIS — Z01812 Encounter for preprocedural laboratory examination: Secondary | ICD-10-CM | POA: Diagnosis not present

## 2022-07-14 DIAGNOSIS — R7303 Prediabetes: Secondary | ICD-10-CM | POA: Insufficient documentation

## 2022-07-14 HISTORY — DX: Prediabetes: R73.03

## 2022-07-14 HISTORY — DX: Unspecified viral hepatitis B without hepatic coma: B19.10

## 2022-07-14 HISTORY — DX: Benign prostatic hyperplasia without lower urinary tract symptoms: N40.0

## 2022-07-14 HISTORY — DX: Other intervertebral disc degeneration, lumbar region without mention of lumbar back pain or lower extremity pain: M51.369

## 2022-07-14 HISTORY — DX: Other intervertebral disc degeneration, lumbar region: M51.36

## 2022-07-14 LAB — URINALYSIS, ROUTINE W REFLEX MICROSCOPIC
Bacteria, UA: NONE SEEN
Bilirubin Urine: NEGATIVE
Glucose, UA: >=500 mg/dL — AB
Hgb urine dipstick: NEGATIVE
Ketones, ur: NEGATIVE mg/dL
Leukocytes,Ua: NEGATIVE
Nitrite: NEGATIVE
Protein, ur: NEGATIVE mg/dL
Specific Gravity, Urine: 1.025 (ref 1.005–1.030)
Squamous Epithelial / HPF: NONE SEEN (ref 0–5)
pH: 5 (ref 5.0–8.0)

## 2022-07-14 LAB — CBC
HCT: 43 % (ref 39.0–52.0)
Hemoglobin: 15.2 g/dL (ref 13.0–17.0)
MCH: 31.7 pg (ref 26.0–34.0)
MCHC: 35.3 g/dL (ref 30.0–36.0)
MCV: 89.6 fL (ref 80.0–100.0)
Platelets: 153 10*3/uL (ref 150–400)
RBC: 4.8 MIL/uL (ref 4.22–5.81)
RDW: 12.5 % (ref 11.5–15.5)
WBC: 6.3 10*3/uL (ref 4.0–10.5)
nRBC: 0 % (ref 0.0–0.2)

## 2022-07-14 LAB — SURGICAL PCR SCREEN
MRSA, PCR: NEGATIVE
Staphylococcus aureus: NEGATIVE

## 2022-07-14 LAB — BASIC METABOLIC PANEL
Anion gap: 4 — ABNORMAL LOW (ref 5–15)
BUN: 17 mg/dL (ref 6–20)
CO2: 25 mmol/L (ref 22–32)
Calcium: 8.4 mg/dL — ABNORMAL LOW (ref 8.9–10.3)
Chloride: 108 mmol/L (ref 98–111)
Creatinine, Ser: 0.71 mg/dL (ref 0.61–1.24)
GFR, Estimated: >60 mL/min (ref >60)
Glucose, Bld: 249 mg/dL — ABNORMAL HIGH (ref 70–99)
Potassium: 4 mmol/L (ref 3.5–5.1)
Sodium: 137 mmol/L (ref 135–145)

## 2022-07-14 LAB — TYPE AND SCREEN
ABO/RH(D): AB POS
Antibody Screen: NEGATIVE

## 2022-07-14 NOTE — Patient Instructions (Addendum)
Your procedure is scheduled on: Friday, December 22 Report to the Registration Desk on the 1st floor of the [facility]. To find out your arrival time, please call [phone] between 1PM - 3PM on: Thursday, December 21 If your arrival time is 6:00 am, do not arrive prior to that time as the [place] entrance doors do not open until 6:00 am.  REMEMBER: Instructions that are not followed completely may result in serious medical risk, up to and including death; or upon the discretion of your surgeon and anesthesiologist your surgery may need to be rescheduled.  Do not eat food after midnight the night before surgery.  No gum chewing, lozengers or hard candies.  You may however, drink CLEAR liquids up to 2 hours before you are scheduled to arrive for your surgery. Do not drink anything within 2 hours of your scheduled arrival time.  Clear liquids include: - water  - apple juice without pulp - gatorade (not RED colors) - black coffee or tea (Do NOT add milk or creamers to the coffee or tea) Do NOT drink anything that is not on this list.  TAKE THESE MEDICATIONS THE MORNING OF SURGERY WITH A SIP OF WATER:  Pantoprazole (Protonix) - (take one the night before and one on the morning of surgery - helps to prevent nausea after surgery.) Sertraline (Zolft) Tamsulosin (Flomax) Vemlidy  One week prior to surgery: starting December 15 Stop Anti-inflammatories (NSAIDS) such as Advil, Aleve, Ibuprofen, Motrin, Naproxen, Naprosyn and Aspirin based products such as Excedrin, Goodys Powder, BC Powder. Stop ANY OVER THE COUNTER supplements until after surgery. You may however, continue to take Tylenol if needed for pain up until the day of surgery.  No Alcohol for 24 hours before or after surgery.  No Smoking including e-cigarettes for 24 hours prior to surgery.  No chewable tobacco products for at least 6 hours prior to surgery.  No nicotine patches on the day of surgery.  Do not use any  "recreational" drugs for at least a week prior to your surgery.  Please be advised that the combination of cocaine and anesthesia may have negative outcomes, up to and including death. If you test positive for cocaine, your surgery will be cancelled.  On the morning of surgery brush your teeth with toothpaste and water, you may rinse your mouth with mouthwash if you wish. Do not swallow any toothpaste or mouthwash.  Use CHG Soap as directed on instruction sheet.  Do not wear jewelry, make-up, hairpins, clips or nail polish.  Do not wear lotions, powders, or perfumes.   Do not shave body from the neck down 48 hours prior to surgery just in case you cut yourself which could leave a site for infection.  Also, freshly shaved skin may become irritated if using the CHG soap.  Contact lenses, hearing aids and dentures may not be worn into surgery.  Do not bring valuables to the hospital. [facility] is not responsible for any missing/lost belongings or valuables.   Notify your doctor if there is any change in your medical condition (cold, fever, infection).  Wear comfortable clothing (specific to your surgery type) to the hospital.  After surgery, you can help prevent lung complications by doing breathing exercises.  Take deep breaths and cough every 1-2 hours. Your doctor may order a device called an Incentive Spirometer to help you take deep breaths.  If you are being admitted to the hospital overnight, leave your suitcase in the car. After surgery it  may be brought to your room.  If you are being discharged the day of surgery, you will not be allowed to drive home. You will need a responsible adult (18 years or older) to drive you home and stay with you that night.   If you are taking public transportation, you will need to have a responsible adult (18 years or older) with you. Please confirm with your physician that it is acceptable to use public transportation.   Please call the  [facility] Dept. at [phone] if you have any questions about these instructions.  Surgery Visitation Policy:  Patients undergoing a surgery or procedure may have two family members or support persons with them as long as the person is not COVID-19 positive or experiencing its symptoms.   Inpatient Visitation:    Visiting hours are 7 a.m. to 8 p.m. Up to four visitors are allowed at one time in a patient room. The visitors may rotate out with other people during the day. One designated support person (adult) may remain overnight.  Due to an increase in RSV and influenza rates and associated hospitalizations, children ages 67 and under will not be able to visit patients in [facility]. Masks continue to be strongly recommended.     Preparing for Surgery with CHLORHEXIDINE GLUCONATE (CHG) Soap  Chlorhexidine Gluconate (CHG) Soap  o An antiseptic cleaner that kills germs and bonds with the skin to continue killing germs even after washing  o Used for showering the night before surgery and morning of surgery  Before surgery, you can play an important role by reducing the number of germs on your skin.  CHG (Chlorhexidine gluconate) soap is an antiseptic cleanser which kills germs and bonds with the skin to continue killing germs even after washing.  Please do not use if you have an allergy to CHG or antibacterial soaps. If your skin becomes reddened/irritated stop using the CHG.  1. Shower the NIGHT BEFORE SURGERY and the MORNING OF SURGERY with CHG soap.  2. If you choose to wash your hair, wash your hair first as usual with your normal shampoo.  3. After shampooing, rinse your hair and body thoroughly to remove the shampoo.  4. Use CHG as you would any other liquid soap. You can apply CHG directly to the skin and wash gently with a scrungie or a clean washcloth.  5. Apply the CHG soap to your body only from the neck down. Do not use on open wounds or open  sores. Avoid contact with your eyes, ears, mouth, and genitals (private parts). Wash face and genitals (private parts) with your normal soap.  6. Wash thoroughly, paying special attention to the area where your surgery will be performed.  7. Thoroughly rinse your body with warm water.  8. Do not shower/wash with your normal soap after using and rinsing off the CHG soap.  9. Pat yourself dry with a clean towel.  10. Wear clean pajamas to bed the night before surgery.  12. Place clean sheets on your bed the night of your first shower and do not sleep with pets.  13. Shower again with the CHG soap on the day of surgery prior to arriving at the hospital.  14. Do not apply any deodorants/lotions/powders.  15. Please wear clean clothes to the hospital.

## 2022-07-15 LAB — HEMOGLOBIN A1C
Hgb A1c MFr Bld: 6.5 % — ABNORMAL HIGH (ref 4.8–5.6)
Mean Plasma Glucose: 140 mg/dL

## 2022-07-21 ENCOUNTER — Encounter: Payer: Self-pay | Admitting: Neurosurgery

## 2022-07-22 ENCOUNTER — Encounter: Payer: Self-pay | Admitting: Neurosurgery

## 2022-07-22 MED ORDER — VANCOMYCIN HCL IN DEXTROSE 1-5 GM/200ML-% IV SOLN
1000.0000 mg | Freq: Once | INTRAVENOUS | Status: AC
  Administered 2022-07-23: 1000 mg via INTRAVENOUS

## 2022-07-22 MED ORDER — CHLORHEXIDINE GLUCONATE 0.12 % MT SOLN: 15.0000 mL | Freq: Once | OROMUCOSAL | Status: AC

## 2022-07-22 MED ORDER — ORAL CARE MOUTH RINSE: 15.0000 mL | Freq: Once | OROMUCOSAL | Status: AC

## 2022-07-22 MED ORDER — CEFAZOLIN IN SODIUM CHLORIDE 2-0.9 GM/100ML-% IV SOLN
2.0000 g | Freq: Once | INTRAVENOUS | Status: DC
  Filled 2022-07-22: qty 100

## 2022-07-22 MED ORDER — LACTATED RINGERS IV SOLN: INTRAVENOUS | Status: DC

## 2022-07-22 MED ORDER — CEFAZOLIN SODIUM-DEXTROSE 2-4 GM/100ML-% IV SOLN
2.0000 g | INTRAVENOUS | Status: AC
  Administered 2022-07-23: 2 g via INTRAVENOUS

## 2022-07-22 NOTE — Telephone Encounter (Signed)
Patty spoke with the patient.

## 2022-07-23 ENCOUNTER — Encounter: Payer: Self-pay | Admitting: Neurosurgery

## 2022-07-23 ENCOUNTER — Other Ambulatory Visit: Payer: Self-pay

## 2022-07-23 ENCOUNTER — Inpatient Hospital Stay: Payer: 59 | Admitting: Registered Nurse

## 2022-07-23 ENCOUNTER — Encounter
Admission: RE | Disposition: A | Discharge status: Home / Self Care | Payer: Self-pay | Source: Ambulatory Visit | Attending: Neurosurgery

## 2022-07-23 ENCOUNTER — Inpatient Hospital Stay: Payer: 59

## 2022-07-23 ENCOUNTER — Inpatient Hospital Stay
Admission: RE | Admit: 2022-07-23 | Discharge: 2022-07-25 | DRG: 455 | Disposition: A | Discharge status: Home / Self Care | Payer: 59 | Source: Ambulatory Visit | Attending: Neurosurgery | Admitting: Neurosurgery

## 2022-07-23 ENCOUNTER — Inpatient Hospital Stay: Payer: 59 | Admitting: Urgent Care

## 2022-07-23 DIAGNOSIS — M5126 Other intervertebral disc displacement, lumbar region: Secondary | ICD-10-CM

## 2022-07-23 DIAGNOSIS — Z87891 Personal history of nicotine dependence: Secondary | ICD-10-CM

## 2022-07-23 DIAGNOSIS — M5442 Lumbago with sciatica, left side: Secondary | ICD-10-CM | POA: Diagnosis not present

## 2022-07-23 DIAGNOSIS — Z79899 Other long term (current) drug therapy: Secondary | ICD-10-CM | POA: Diagnosis not present

## 2022-07-23 DIAGNOSIS — R7303 Prediabetes: Secondary | ICD-10-CM | POA: Diagnosis present

## 2022-07-23 DIAGNOSIS — M5416 Radiculopathy, lumbar region: Secondary | ICD-10-CM

## 2022-07-23 DIAGNOSIS — Z01812 Encounter for preprocedural laboratory examination: Principal | ICD-10-CM

## 2022-07-23 DIAGNOSIS — M5116 Intervertebral disc disorders with radiculopathy, lumbar region: Principal | ICD-10-CM | POA: Diagnosis present

## 2022-07-23 DIAGNOSIS — K219 Gastro-esophageal reflux disease without esophagitis: Secondary | ICD-10-CM | POA: Diagnosis present

## 2022-07-23 DIAGNOSIS — G8929 Other chronic pain: Secondary | ICD-10-CM

## 2022-07-23 DIAGNOSIS — Z981 Arthrodesis status: Secondary | ICD-10-CM

## 2022-07-23 DIAGNOSIS — Z01818 Encounter for other preprocedural examination: Secondary | ICD-10-CM

## 2022-07-23 DIAGNOSIS — N400 Enlarged prostate without lower urinary tract symptoms: Secondary | ICD-10-CM | POA: Diagnosis present

## 2022-07-23 DIAGNOSIS — F419 Anxiety disorder, unspecified: Secondary | ICD-10-CM | POA: Diagnosis present

## 2022-07-23 HISTORY — PX: LUMBAR LAMINECTOMY/ DECOMPRESSION WITH MET-RX: SHX5959

## 2022-07-23 HISTORY — PX: APPLICATION OF INTRAOPERATIVE CT SCAN: SHX6668

## 2022-07-23 HISTORY — PX: ANTERIOR LATERAL LUMBAR FUSION WITH PERCUTANEOUS SCREW 1 LEVEL: SHX5553

## 2022-07-23 SURGERY — ANTERIOR LATERAL LUMBAR FUSION WITH PERCUTANEOUS SCREW 1 LEVEL
Anesthesia: General

## 2022-07-23 MED ORDER — GLYCOPYRROLATE 0.2 MG/ML IJ SOLN
INTRAMUSCULAR | Status: DC | PRN
  Administered 2022-07-23: .2 mg via INTRAVENOUS

## 2022-07-23 MED ORDER — KETOROLAC TROMETHAMINE 15 MG/ML IJ SOLN
INTRAMUSCULAR | Status: AC
  Filled 2022-07-23: qty 1

## 2022-07-23 MED ORDER — PHENYLEPHRINE HCL (PRESSORS) 10 MG/ML IV SOLN
INTRAVENOUS | Status: AC
  Filled 2022-07-23: qty 1

## 2022-07-23 MED ORDER — FENTANYL CITRATE (PF) 100 MCG/2ML IJ SOLN
INTRAMUSCULAR | Status: AC
  Filled 2022-07-23: qty 2

## 2022-07-23 MED ORDER — FENTANYL CITRATE (PF) 100 MCG/2ML IJ SOLN
25.0000 ug | INTRAMUSCULAR | Status: DC | PRN
  Administered 2022-07-23 (×3): 25 ug via INTRAVENOUS

## 2022-07-23 MED ORDER — LIDOCAINE HCL (CARDIAC) PF 100 MG/5ML IV SOSY
PREFILLED_SYRINGE | INTRAVENOUS | Status: DC | PRN
  Administered 2022-07-23: 100 mg via INTRAVENOUS

## 2022-07-23 MED ORDER — POLYETHYLENE GLYCOL 3350 17 G PO PACK
17.0000 g | PACK | Freq: Every day | ORAL | Status: DC | PRN
  Administered 2022-07-25: 17 g via ORAL
  Filled 2022-07-23: qty 1

## 2022-07-23 MED ORDER — BUPIVACAINE HCL (PF) 0.5 % IJ SOLN
INTRAMUSCULAR | Status: AC
  Filled 2022-07-23: qty 30

## 2022-07-23 MED ORDER — PROPOFOL 1000 MG/100ML IV EMUL
INTRAVENOUS | Status: AC
  Filled 2022-07-23: qty 200

## 2022-07-23 MED ORDER — METHOCARBAMOL 1000 MG/10ML IJ SOLN
500.0000 mg | Freq: Four times a day (QID) | INTRAVENOUS | Status: DC | PRN
  Administered 2022-07-23: 500 mg via INTRAVENOUS
  Filled 2022-07-23: qty 500

## 2022-07-23 MED ORDER — ONDANSETRON HCL 4 MG/2ML IJ SOLN: 4.0000 mg | Freq: Once | INTRAMUSCULAR | Status: DC | PRN

## 2022-07-23 MED ORDER — TENOFOVIR ALAFENAMIDE FUMARATE 25 MG PO TABS
25.0000 mg | ORAL_TABLET | Freq: Every day | ORAL | Status: DC
  Administered 2022-07-24 – 2022-07-25 (×2): 25 mg via ORAL
  Filled 2022-07-23 (×2): qty 1
  Filled 2022-07-23: qty 30

## 2022-07-23 MED ORDER — BUPIVACAINE LIPOSOME 1.3 % IJ SUSP
INTRAMUSCULAR | Status: AC
  Filled 2022-07-23: qty 20

## 2022-07-23 MED ORDER — OXYCODONE HCL 5 MG PO TABS
5.0000 mg | ORAL_TABLET | ORAL | Status: DC | PRN
  Administered 2022-07-24: 5 mg via ORAL
  Filled 2022-07-23: qty 1

## 2022-07-23 MED ORDER — ACETAMINOPHEN 500 MG PO TABS
1000.0000 mg | ORAL_TABLET | Freq: Four times a day (QID) | ORAL | Status: AC
  Administered 2022-07-24 (×4): 1000 mg via ORAL
  Filled 2022-07-23 (×4): qty 2

## 2022-07-23 MED ORDER — PHENYLEPHRINE HCL-NACL 20-0.9 MG/250ML-% IV SOLN
INTRAVENOUS | Status: DC | PRN
  Administered 2022-07-23: 25 ug/min via INTRAVENOUS

## 2022-07-23 MED ORDER — SUCCINYLCHOLINE CHLORIDE 200 MG/10ML IV SOSY
PREFILLED_SYRINGE | INTRAVENOUS | Status: DC | PRN
  Administered 2022-07-23: 100 mg via INTRAVENOUS

## 2022-07-23 MED ORDER — BISACODYL 10 MG RE SUPP: 10.0000 mg | Freq: Every day | RECTAL | Status: DC | PRN

## 2022-07-23 MED ORDER — BUPIVACAINE-EPINEPHRINE 0.5% -1:200000 IJ SOLN
INTRAMUSCULAR | Status: DC | PRN
  Administered 2022-07-23: 2 mL
  Administered 2022-07-23: 7 mL

## 2022-07-23 MED ORDER — IRRISEPT - 450ML BOTTLE WITH 0.05% CHG IN STERILE WATER, USP 99.95% OPTIME
TOPICAL | Status: DC | PRN
  Administered 2022-07-23: 450 mL

## 2022-07-23 MED ORDER — MENTHOL 3 MG MT LOZG: 1.0000 | LOZENGE | OROMUCOSAL | Status: DC | PRN

## 2022-07-23 MED ORDER — REMIFENTANIL HCL 1 MG IV SOLR
INTRAVENOUS | Status: AC
  Filled 2022-07-23: qty 1000

## 2022-07-23 MED ORDER — PANTOPRAZOLE SODIUM 40 MG PO TBEC
40.0000 mg | DELAYED_RELEASE_TABLET | Freq: Every day | ORAL | Status: DC
  Administered 2022-07-24 – 2022-07-25 (×2): 40 mg via ORAL
  Filled 2022-07-23 (×2): qty 1

## 2022-07-23 MED ORDER — TAMSULOSIN HCL 0.4 MG PO CAPS
0.4000 mg | ORAL_CAPSULE | Freq: Every day | ORAL | Status: DC
  Administered 2022-07-24 – 2022-07-25 (×2): 0.4 mg via ORAL
  Filled 2022-07-23 (×2): qty 1

## 2022-07-23 MED ORDER — CHLORHEXIDINE GLUCONATE 0.12 % MT SOLN
OROMUCOSAL | Status: AC
  Administered 2022-07-23: 15 mL via OROMUCOSAL
  Filled 2022-07-23: qty 15

## 2022-07-23 MED ORDER — SENNA 8.6 MG PO TABS
1.0000 | ORAL_TABLET | Freq: Two times a day (BID) | ORAL | Status: DC
  Administered 2022-07-23 – 2022-07-25 (×4): 8.6 mg via ORAL
  Filled 2022-07-23 (×4): qty 1

## 2022-07-23 MED ORDER — DEXAMETHASONE SODIUM PHOSPHATE 10 MG/ML IJ SOLN
INTRAMUSCULAR | Status: DC | PRN
  Administered 2022-07-23: 8 mg via INTRAVENOUS

## 2022-07-23 MED ORDER — DEXAMETHASONE SODIUM PHOSPHATE 10 MG/ML IJ SOLN
INTRAMUSCULAR | Status: AC
  Filled 2022-07-23: qty 1

## 2022-07-23 MED ORDER — SODIUM CHLORIDE FLUSH 0.9 % IV SOLN
INTRAVENOUS | Status: AC
  Filled 2022-07-23: qty 20

## 2022-07-23 MED ORDER — SERTRALINE HCL 50 MG PO TABS
100.0000 mg | ORAL_TABLET | Freq: Every day | ORAL | Status: DC
  Administered 2022-07-24 – 2022-07-25 (×2): 100 mg via ORAL
  Filled 2022-07-23 (×2): qty 2

## 2022-07-23 MED ORDER — REMIFENTANIL HCL 1 MG IV SOLR
INTRAVENOUS | Status: DC | PRN
  Administered 2022-07-23: .05 ug/kg/min via INTRAVENOUS

## 2022-07-23 MED ORDER — LIDOCAINE HCL (PF) 2 % IJ SOLN
INTRAMUSCULAR | Status: AC
  Filled 2022-07-23: qty 5

## 2022-07-23 MED ORDER — MINERAL OIL LIGHT OIL
TOPICAL_OIL | Status: AC
  Filled 2022-07-23: qty 10

## 2022-07-23 MED ORDER — SODIUM CHLORIDE 0.9 % IV SOLN: INTRAVENOUS | Status: DC

## 2022-07-23 MED ORDER — MIDAZOLAM HCL 5 MG/5ML IJ SOLN
INTRAMUSCULAR | Status: DC | PRN
  Administered 2022-07-23: 2 mg via INTRAVENOUS

## 2022-07-23 MED ORDER — METHOCARBAMOL 500 MG PO TABS
500.0000 mg | ORAL_TABLET | Freq: Four times a day (QID) | ORAL | Status: DC | PRN
  Administered 2022-07-24 – 2022-07-25 (×3): 500 mg via ORAL
  Filled 2022-07-23 (×3): qty 1

## 2022-07-23 MED ORDER — VANCOMYCIN HCL IN DEXTROSE 1-5 GM/200ML-% IV SOLN
INTRAVENOUS | Status: AC
  Filled 2022-07-23: qty 200

## 2022-07-23 MED ORDER — ENOXAPARIN SODIUM 40 MG/0.4ML IJ SOSY
40.0000 mg | PREFILLED_SYRINGE | INTRAMUSCULAR | Status: DC
  Administered 2022-07-24 – 2022-07-25 (×2): 40 mg via SUBCUTANEOUS
  Filled 2022-07-23 (×2): qty 0.4

## 2022-07-23 MED ORDER — ACETAMINOPHEN 10 MG/ML IV SOLN
INTRAVENOUS | Status: AC
  Filled 2022-07-23: qty 100

## 2022-07-23 MED ORDER — SODIUM CHLORIDE 0.9% FLUSH
3.0000 mL | Freq: Two times a day (BID) | INTRAVENOUS | Status: DC
  Administered 2022-07-23 – 2022-07-24 (×2): 3 mL via INTRAVENOUS

## 2022-07-23 MED ORDER — MIDAZOLAM HCL 2 MG/2ML IJ SOLN
INTRAMUSCULAR | Status: AC
  Filled 2022-07-23: qty 2

## 2022-07-23 MED ORDER — ONDANSETRON HCL 4 MG PO TABS: 4.0000 mg | ORAL_TABLET | Freq: Four times a day (QID) | ORAL | Status: DC | PRN

## 2022-07-23 MED ORDER — ONDANSETRON HCL 4 MG/2ML IJ SOLN
INTRAMUSCULAR | Status: AC
  Filled 2022-07-23: qty 2

## 2022-07-23 MED ORDER — PHENOL 1.4 % MT LIQD: 1.0000 | OROMUCOSAL | Status: DC | PRN

## 2022-07-23 MED ORDER — ONDANSETRON HCL 4 MG/2ML IJ SOLN: 4.0000 mg | Freq: Four times a day (QID) | INTRAMUSCULAR | Status: DC | PRN

## 2022-07-23 MED ORDER — SODIUM CHLORIDE 0.9 % IV SOLN: 250.0000 mL | INTRAVENOUS | Status: DC

## 2022-07-23 MED ORDER — CEFAZOLIN SODIUM-DEXTROSE 2-4 GM/100ML-% IV SOLN
INTRAVENOUS | Status: AC
  Filled 2022-07-23: qty 100

## 2022-07-23 MED ORDER — SODIUM CHLORIDE 0.9% FLUSH: 3.0000 mL | INTRAVENOUS | Status: DC | PRN

## 2022-07-23 MED ORDER — PROPOFOL 500 MG/50ML IV EMUL
INTRAVENOUS | Status: DC | PRN
  Administered 2022-07-23: 175 ug/kg/min via INTRAVENOUS
  Administered 2022-07-23: 50 ug/kg/min via INTRAVENOUS

## 2022-07-23 MED ORDER — PROPOFOL 1000 MG/100ML IV EMUL
INTRAVENOUS | Status: AC
  Filled 2022-07-23: qty 100

## 2022-07-23 MED ORDER — FENTANYL CITRATE (PF) 100 MCG/2ML IJ SOLN
INTRAMUSCULAR | Status: DC | PRN
  Administered 2022-07-23 (×2): 50 ug via INTRAVENOUS

## 2022-07-23 MED ORDER — PROPOFOL 10 MG/ML IV BOLUS
INTRAVENOUS | Status: DC | PRN
  Administered 2022-07-23: 180 mg via INTRAVENOUS
  Administered 2022-07-23: 60 mg via INTRAVENOUS

## 2022-07-23 MED ORDER — SURGIFLO WITH THROMBIN (HEMOSTATIC MATRIX KIT) OPTIME
TOPICAL | Status: DC | PRN
  Administered 2022-07-23: 1 via TOPICAL

## 2022-07-23 MED ORDER — SUCCINYLCHOLINE CHLORIDE 200 MG/10ML IV SOSY
PREFILLED_SYRINGE | INTRAVENOUS | Status: AC
  Filled 2022-07-23: qty 10

## 2022-07-23 MED ORDER — HYDROMORPHONE HCL 1 MG/ML IJ SOLN
INTRAMUSCULAR | Status: AC
  Filled 2022-07-23: qty 1

## 2022-07-23 MED ORDER — ACETAMINOPHEN 10 MG/ML IV SOLN
INTRAVENOUS | Status: DC | PRN
  Administered 2022-07-23: 1000 mg via INTRAVENOUS

## 2022-07-23 MED ORDER — KETOROLAC TROMETHAMINE 15 MG/ML IJ SOLN
15.0000 mg | Freq: Four times a day (QID) | INTRAMUSCULAR | Status: DC
  Administered 2022-07-23 – 2022-07-25 (×7): 15 mg via INTRAVENOUS
  Filled 2022-07-23 (×6): qty 1

## 2022-07-23 MED ORDER — 0.9 % SODIUM CHLORIDE (POUR BTL) OPTIME
TOPICAL | Status: DC | PRN
  Administered 2022-07-23: 500 mL

## 2022-07-23 MED ORDER — DOCUSATE SODIUM 100 MG PO CAPS
100.0000 mg | ORAL_CAPSULE | Freq: Two times a day (BID) | ORAL | Status: DC
  Administered 2022-07-23 – 2022-07-25 (×4): 100 mg via ORAL
  Filled 2022-07-23 (×4): qty 1

## 2022-07-23 MED ORDER — KETAMINE HCL 10 MG/ML IJ SOLN
INTRAMUSCULAR | Status: DC | PRN
  Administered 2022-07-23: 10 mg via INTRAVENOUS
  Administered 2022-07-23 (×2): 20 mg via INTRAVENOUS

## 2022-07-23 MED ORDER — GLYCOPYRROLATE 0.2 MG/ML IJ SOLN
INTRAMUSCULAR | Status: AC
  Filled 2022-07-23: qty 1

## 2022-07-23 MED ORDER — BUPIVACAINE-EPINEPHRINE (PF) 0.5% -1:200000 IJ SOLN
INTRAMUSCULAR | Status: AC
  Filled 2022-07-23: qty 30

## 2022-07-23 MED ORDER — OXYCODONE HCL 5 MG PO TABS
10.0000 mg | ORAL_TABLET | ORAL | Status: DC | PRN
  Administered 2022-07-23 – 2022-07-25 (×5): 10 mg via ORAL
  Filled 2022-07-23 (×6): qty 2

## 2022-07-23 MED ORDER — KETAMINE HCL 50 MG/5ML IJ SOSY
PREFILLED_SYRINGE | INTRAMUSCULAR | Status: AC
  Filled 2022-07-23: qty 5

## 2022-07-23 MED ORDER — MORPHINE SULFATE (PF) 2 MG/ML IV SOLN: 2.0000 mg | INTRAVENOUS | Status: DC | PRN

## 2022-07-23 MED ORDER — MAGNESIUM CITRATE PO SOLN: 1.0000 | Freq: Once | ORAL | Status: DC | PRN

## 2022-07-23 MED ORDER — HYDROMORPHONE HCL 1 MG/ML IJ SOLN
0.2500 mg | INTRAMUSCULAR | Status: DC | PRN
  Administered 2022-07-23 (×2): 0.25 mg via INTRAVENOUS

## 2022-07-23 MED ORDER — SODIUM CHLORIDE (PF) 0.9 % IJ SOLN
INTRAMUSCULAR | Status: DC | PRN
  Administered 2022-07-23: 60 mL

## 2022-07-23 SURGICAL SUPPLY — 84 items
ALLOGRAFT BONESTRIP KORE 2.5X5 (Bone Implant) IMPLANT
BASIN KIT SINGLE STR (MISCELLANEOUS) ×2 IMPLANT
BUR NEURO DRILL SOFT 3.0X3.8M (BURR) IMPLANT
CAP LOCKING SPINE (Cap) IMPLANT
CHLORAPREP W/TINT 26 (MISCELLANEOUS) ×8 IMPLANT
CLIP SPRING LAT STIMULATING (CLIP) IMPLANT
CORD BIP STRL DISP 12FT (MISCELLANEOUS) ×2 IMPLANT
CORD LIGHT LATERIAL X LIFT (MISCELLANEOUS) IMPLANT
COUNTER NEEDLE 20/40 LG (NEEDLE) ×4 IMPLANT
COVER BACK TABLE REUSABLE LG (DRAPES) ×2 IMPLANT
CUP MEDICINE 2OZ PLAST GRAD ST (MISCELLANEOUS) ×2 IMPLANT
DERMABOND ADVANCED .7 DNX12 (GAUZE/BANDAGES/DRESSINGS) IMPLANT
DRAPE 3D C-ARM OEC (DRAPES) IMPLANT
DRAPE C ARM PK CFD 31 SPINE (DRAPES) ×2 IMPLANT
DRAPE C-ARMOR (DRAPES) IMPLANT
DRAPE INCISE IOBAN 66X45 STRL (DRAPES) ×2 IMPLANT
DRAPE LAPAROTOMY 100X77 ABD (DRAPES) ×4 IMPLANT
DRAPE MICROSCOPE SPINE 48X150 (DRAPES) IMPLANT
DRAPE SCAN PATIENT (DRAPES) ×2 IMPLANT
DRAPE SURG 17X11 SM STRL (DRAPES) ×4 IMPLANT
DRSG OPSITE POSTOP 4X6 (GAUZE/BANDAGES/DRESSINGS) IMPLANT
DRSG OPSITE POSTOP 4X8 (GAUZE/BANDAGES/DRESSINGS) IMPLANT
DRSG TEGADERM 4X4.75 (GAUZE/BANDAGES/DRESSINGS) IMPLANT
ELECT CAUTERY BLADE TIP 2.5 (TIP) ×2
ELECT EZSTD 165MM 6.5IN (MISCELLANEOUS) ×2
ELECT REM PT RETURN 9FT ADLT (ELECTROSURGICAL) ×4
ELECTRODE CAUTERY BLDE TIP 2.5 (TIP) ×2 IMPLANT
ELECTRODE EZSTD 165MM 6.5IN (MISCELLANEOUS) ×2 IMPLANT
ELECTRODE REM PT RTRN 9FT ADLT (ELECTROSURGICAL) ×4 IMPLANT
FEE INTRAOP CADWELL SUPPLY NCS (MISCELLANEOUS) ×2 IMPLANT
FEE INTRAOP MONITOR IMPULS NCS (MISCELLANEOUS) ×2 IMPLANT
FORCEPS BPLR BAYO 10IN 1.0TIP (ORTHOPEDIC DISPOSABLE SUPPLIES) IMPLANT
GAUZE 4X4 16PLY ~~LOC~~+RFID DBL (SPONGE) ×2 IMPLANT
GLOVE SURG SYN 6.5 ES PF (GLOVE) ×10 IMPLANT
GLOVE SURG SYN 6.5 PF PI (GLOVE) ×10 IMPLANT
GLOVE SURG SYN 8.5  E (GLOVE) ×12
GLOVE SURG SYN 8.5 E (GLOVE) ×12 IMPLANT
GLOVE SURG SYN 8.5 PF PI (GLOVE) ×12 IMPLANT
GLOVE SURG UNDER POLY LF SZ6.5 (GLOVE) ×6 IMPLANT
GLOVE SURG UNDER POLY LF SZ8.5 (GLOVE) ×6 IMPLANT
GOWN SRG LRG LVL 4 IMPRV REINF (GOWNS) ×4 IMPLANT
GOWN SRG XL LVL 3 NONREINFORCE (GOWNS) ×4 IMPLANT
GOWN STRL NON-REIN TWL XL LVL3 (GOWNS) ×4
GOWN STRL REIN LRG LVL4 (GOWNS) ×4
GRADUATE 1200CC STRL 31836 (MISCELLANEOUS) ×2 IMPLANT
HEMOVAC 400CC 10FR (MISCELLANEOUS) IMPLANT
INTRAOP CADWELL SUPPLY FEE NCS (MISCELLANEOUS) ×2
INTRAOP DISP SUPPLY FEE NCS (MISCELLANEOUS) ×2
INTRAOP MONITOR FEE IMPULS NCS (MISCELLANEOUS) ×2
INTRAOP MONITOR FEE IMPULSE (MISCELLANEOUS) ×2
K-WIRE 1.6 NITINOL SHARP TIP (WIRE) ×2
KIT DISP MARS 3V (KITS) IMPLANT
KIT PEDICLE ACCESS (KITS) IMPLANT
KIT SPINAL PRONEVIEW (KITS) ×2 IMPLANT
KIT TURNOVER KIT A (KITS) ×2 IMPLANT
KNIFE BAYONET SHORT DISCETOMY (MISCELLANEOUS) IMPLANT
KNIFE BOYONETTED ANNULOTOMY (MISCELLANEOUS) IMPLANT
KWIRE 1.6 NITINOL SHARP TIP (WIRE) IMPLANT
MANIFOLD NEPTUNE II (INSTRUMENTS) ×4 IMPLANT
MARKER SKIN DUAL TIP RULER LAB (MISCELLANEOUS) ×6 IMPLANT
MARKER SPHERE PSV REFLC 13MM (MARKER) ×14 IMPLANT
NDL SAFETY ECLIP 18X1.5 (MISCELLANEOUS) ×2 IMPLANT
PACK LAMINECTOMY NEURO (CUSTOM PROCEDURE TRAY) ×2 IMPLANT
PAD ARMBOARD 7.5X6 YLW CONV (MISCELLANEOUS) ×2 IMPLANT
ROD SPINE CURVE 5.5X55 (Rod) IMPLANT
SCREW CREO 6.5X50 (Screw) IMPLANT
SCREW CREO MIS 30 TULIP (Screw) IMPLANT
SPACER HEDRON 18X55X13 10D (Spacer) IMPLANT
SPONGE GAUZE 2X2 8PLY STRL LF (GAUZE/BANDAGES/DRESSINGS) IMPLANT
STAPLER SKIN PROX 35W (STAPLE) IMPLANT
SURGIFLO W/THROMBIN 8M KIT (HEMOSTASIS) ×2 IMPLANT
SUT DVC VLOC 3-0 CL 6 P-12 (SUTURE) ×2 IMPLANT
SUT ETHILON 2 0 FS 18 (SUTURE) IMPLANT
SUT VIC AB 0 CT1 27 (SUTURE) ×4
SUT VIC AB 0 CT1 27XCR 8 STRN (SUTURE) ×2 IMPLANT
SUT VIC AB 2-0 CT1 18 (SUTURE) ×2 IMPLANT
SYR 30ML LL (SYRINGE) ×4 IMPLANT
SYSTEM MIS ILLUMINATION (SYSTAGENIX WOUND MANAGEMENT) IMPLANT
TOWEL OR 17X26 4PK STRL BLUE (TOWEL DISPOSABLE) ×10 IMPLANT
TRAP FLUID SMOKE EVACUATOR (MISCELLANEOUS) ×2 IMPLANT
TRAY FOLEY MTR SLVR 16FR STAT (SET/KITS/TRAYS/PACK) IMPLANT
TUBING CONNECTING 10 (TUBING) ×4 IMPLANT
WATER STERILE IRR 1000ML POUR (IV SOLUTION) ×4 IMPLANT
WATER STERILE IRR 500ML POUR (IV SOLUTION) IMPLANT

## 2022-07-23 NOTE — Interval H&P Note (Signed)
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  The various methods of treatment have been discussed with the patient and family. After consideration of risks, benefits and other options for treatment, the patient has consented to  Procedure(s): L4-5 LATERAL LUMBAR INTERBODY FUSION WITH POSTERIOR SPINAL FUSION (N/A) LEFT L4-5 LUMBAR DECOMPRESSION WITH MET-RX (Left) APPLICATION OF INTRAOPERATIVE CT SCAN (N/A) as a surgical intervention.  The patient's history has been reviewed, patient examined, no change in status, stable for surgery.  I have reviewed the patient's chart and labs.  Questions were answered to the patient's satisfaction.    Heart sounds normal no MRG. Chest Clear to Auscultation Bilaterally.  Britton Bera

## 2022-07-23 NOTE — Op Note (Signed)
Indications: 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   Preoperative Note:   Risks of surgery discussed include: infection, bleeding, stroke, coma, death, paralysis, CSF leak, nerve/spinal cord injury, numbness, tingling, weakness, complex regional pain syndrome, recurrent stenosis and/or disc herniation, vascular injury, development of instability, neck/back pain, need for further surgery, persistent symptoms, development of deformity, and the risks of anesthesia. The patient understood these risks and agreed to proceed.  NAME OF ANTERIOR PROCEDURE:               1. Anterior lumbar interbody fusion via a right lateral retroperitoneal approach at L4/5 2. Placement of a Lordotic  Globus Hedron interbody cage, filled with Demineralized Bone Matrix  NAME OF POSTERIOR PROCEDURE 1. Posterior instrumentation using Globus Creo Instrumentation 2. Posterolateral fusion, L4/5, utilizing demineralized bone matrix 3. Use of Stereotaxis 4. L L4/5 decompression   PROCEDURE:  Patient was brought to the operating room, intubated, turned to the lateral position.  All pressure points were checked and double-checked.  The patient was prepped and draped in the standard fashion. Prior to prepping, fluoroscopy was brought in and the patient was positioned with a large bump under the contralateral side between the iliac crest and  rib cage, allowing the area between the iliac crest and the lateral aspect of the rib cage to open and increase the ability to reach inferiorly, to facilitate entry into the disc space.  The incision was marked upon the skin both the location of the disc space as well as the superior most aspect of the iliac crest.  Based on the identification of the disc space an incision was prepared, marked upon the skin and eventually was used for our lateral incision.  The fluoroscopy was turned into a cross table A/P image in order to confirm that the patient's spine remained in a perpendicular trajectory to the floor without rotation.  Once confirming that all the pressure points were checked and double-checked and the patient remained in sturdy position strapped down in this slightly jack-knifed lateral position, the patient was prepped and draped in standard fashion.  The skin was injected with local anesthetic, then incised until the abdominal wall fascia was noted.  I bluntly dissected posteriorly until we were able to identify the posterior musculature near petit's triangle.  At this point, using primarily blunt dissection with our finger aided with a metzenbaum scissor, were able to enter the retroperitoneal cavity.  The retroperitoneal potential space was opened further until palpating out the psoas muscle, the medial aspect of the iliac crest, the medial aspect of the last rib and continued to define the retroperitoneal space with blunt dissection in order to facilitate safe placement of our dilators.    While protecting by dissecting directly onto a finger in the retroperitoneum, the retroperitoneal space was entered safely from the lateral incision and the initial dilator placed onto the muscle belly of the psoas.  While directly stimulating the dilator and after radiographically confirming our location relative to the disc  space, I placed the dilator through the psoas.  The dilators were stimulated to ensure  remaining safely away from any of the lumbar plexus nerves; the dilators were repositioned until no pathologic stimulation was appreciated.  Once I had confirmed the location of our initial dilator radiographically, a K-wire secured the dilator into the L4/5 disc space and confirmed position under A/P and lateral fluoroscopy.  At this point, I dilated up with direct stimulation to confirm lack of pathologic stimulation.  Once all the dilators were in position, I placed in the retractor and secured it onto the table, locked into position and confirmed under A/P and lateral fluoroscopy to confirm our approach angle to the disc space as well as location relative to the disc space.  I then placed the muscle stimulator in through the working channel down to the vertebral body, stimulating the entire lateral surface of the vertebral body and any of the visualized psoas muscle that was adjacent to the retractor, confirming again the safe passage to the psoas before we began performing the discectomy.  At this point, we began our discectomy at L4/5.  The disc was incised laterally throughout the extent of our exposure. Using a combination of pituitary rongeurs, Kerrison rongeurs, rasps, curettes of various sorts, we were able to begin to clean out the disc space.  Once we had cleaned out the majority of the disc space, we then cut the lateral annulus with a cob, breaking the lateral annual attachments on the contralateral side by subtly working the cob through the annulus while using flouroscopy.  Care was taken not to extend further than required after cutting the annular attachments.  After this had been performed, we prepared the endplates for placement of our graft, sized a graft to the disc space by serially dilating up in trial sizes until we confirmed that our graft would be well positioned, allowing distraction while maintaining good grip.  This was confirmed under A/P and lateral fluoroscopy in order to ensure  its placement as an eventual trial for placement of our final graft.  We irrigated with bacteriostatic saline.  Once confirmed placement, the Hedron implant filled with allograft was impacted into position at L4/5.   Through a combination of intradiscal distraction and anterior releasing, we were able to correct the anterior deformity during disc preparation and placement of the graft.  At this point, final radiographs were performed, and we began closure.  The wound was closed using 0 Vicryl interrupted suture in the fascia and 2-0 Vicryl inverted suture were placed in the subcutaneous tissue and dermis. 3-0 monocryl was used for final closure. Dermabond was used to close the skin.    After closing the anterior part in layers, the patient was repositioned into prone position.  All pressure points were checked and double-checked.  The posterior operative site was prepped and draped in standard fashion.  The stereotactic array was placed.  Stereotactic images were acquired using intraoperative CT scanning.  This was registered to the patient.  Using stereotaxis, screw trajectories were planned and incisions made.  The pedicles from L4 to L5 were cannulated bilaterally and K wires used to secure the tracks.  We then utilized a stereotactic screwdriver to place pedicle screws from L4 to L5.  At each level, 6.5x85m Globus Creo pedicle screws were placed.  Once the screws were placed, the screw extensions were then linked, a path was formed for the rod and a rod was utilized to connect the screws.  We then  compressed, torqued / counter-torqued and removed the screw assembly. Once performed on each side, the C-arm was brought back and to take confirmatory CT scan showing appropriate placement of all instrumentation and anatomic alignment.    Posterolateral arthrodesis was performed at L4-L5 utilizing demineralized bone matrix.  After performing the fusion at L4-5, the metrx tubes were sequentially  advanced and confirmed in position at L4-5 on the left. An 33m by 667mtube was locked in place to the bed side attachment.  The image guidance system was used to confirm placement. The microscope was then sterilely brought into the field and muscle creep was hemostased with a bipolar and resected with a pituitary rongeur.  A Bovie extender was then used to expose the spinous process and lamina.  Careful attention was placed to not violate the facet capsule. A 3 mm matchstick drill bit was then used to remove the remainder of the left L4 inferior articulating process. The lateral margin of the dura was identified, and the nerve root carefully separated from the soft tissue and posterior longitudinal ligament.   The dura was identified and palpated. The kerrison rongeur was then used to remove the medial facet bilaterally until no compression was noted.  Additional soft tissue underneath the nerve root was also resected until the nerve root was completely free. A balltip probe was used to confirm decompression of the ipsilateral L5 nerve root.  No CSF leak was noted.  The wound was copiously irrigated. The tube system was then removed under microscopic visualization and hemostasis was obtained with a bipolar.  A drain was placed.    The wound was closed using 0 Vicryl interrupted suture in the fascia, 2-0 Vicryl inverted suture were placed in the subcutaneous tissue and dermis. 3-0 monocryl was used for final closure. Dermabond was used to close the skin.    Needle, lap and all counts were correct at the end of the case.    There was no pathologic change in the neuromonitoring during the procedure.   DaCooper RenderA assisted in the entire procedure. An assistant was required for this procedure due to the complexity.  The assistant provided assistance in tissue manipulation and suction, and was required for the successful and safe performance of the procedure. I performed the critical portions of  the procedure.   ChMeade MawD Neurosurgery

## 2022-07-23 NOTE — Progress Notes (Signed)
Sent text messsage to pt's wife stating we are working on reducing his pain.  Pt is resting quietly in bed with eyes closed.

## 2022-07-23 NOTE — Transfer of Care (Signed)
Immediate Anesthesia Transfer of Care Note  Patient: Troy Shelton  Procedure(s) Performed: L4-5 LATERAL LUMBAR INTERBODY FUSION WITH POSTERIOR SPINAL FUSION LEFT L4-5 LUMBAR DECOMPRESSION WITH MET-RX (Left) APPLICATION OF INTRAOPERATIVE CT SCAN  Patient Location: PACU  Anesthesia Type:General  Level of Consciousness: drowsy  Airway & Oxygen Therapy: Patient Spontanous Breathing and Patient connected to face mask oxygen  Post-op Assessment: Report given to RN and Post -op Vital signs reviewed and stable  Post vital signs: Reviewed and stable  Last Vitals:  Vitals Value Taken Time  BP 119/81 07/23/22 1726  Temp 36.7 C 07/23/22 1726  Pulse 92 07/23/22 1729  Resp 18 07/23/22 1729  SpO2 100 % 07/23/22 1729  Vitals shown include unvalidated device data.  Last Pain:  Vitals:   07/23/22 1726  TempSrc:   PainSc: Asleep         Complications: No notable events documented.

## 2022-07-23 NOTE — Anesthesia Procedure Notes (Signed)
Procedure Name: Intubation Date/Time: 07/23/2022 1:12 PM  Performed by: Lia Foyer, CRNAPre-anesthesia Checklist: Patient identified, Emergency Drugs available, Suction available and Patient being monitored Patient Re-evaluated:Patient Re-evaluated prior to induction Oxygen Delivery Method: Circle system utilized Preoxygenation: Pre-oxygenation with 100% oxygen Induction Type: IV induction Ventilation: Mask ventilation with difficulty Laryngoscope Size: McGraph and 3 Grade View: Grade I Tube type: Oral Tube size: 7.0 mm Number of attempts: 1 Airway Equipment and Method: Stylet and Video-laryngoscopy Placement Confirmation: ETT inserted through vocal cords under direct vision, positive ETCO2 and breath sounds checked- equal and bilateral Secured at: 21 cm Tube secured with: Tape Dental Injury: Teeth and Oropharynx as per pre-operative assessment

## 2022-07-23 NOTE — Anesthesia Preprocedure Evaluation (Signed)
Anesthesia Evaluation  Patient identified by MRN, date of birth, ID band Patient awake    Reviewed: Allergy & Precautions, NPO status , Patient's Chart, lab work & pertinent test results  Airway Mallampati: II  TM Distance: >3 FB Neck ROM: full    Dental  (+) Teeth Intact   Pulmonary neg pulmonary ROS, former smoker   Pulmonary exam normal breath sounds clear to auscultation       Cardiovascular Exercise Tolerance: Good negative cardio ROS Normal cardiovascular exam Rhythm:Regular Rate:Normal     Neuro/Psych   Anxiety     negative neurological ROS  negative psych ROS   GI/Hepatic negative GI ROS, Neg liver ROS,GERD  Medicated,,(+) Hepatitis -, B  Endo/Other  negative endocrine ROS    Renal/GU negative Renal ROS     Musculoskeletal negative musculoskeletal ROS (+)    Abdominal Normal abdominal exam  (+)   Peds negative pediatric ROS (+)  Hematology negative hematology ROS (+)   Anesthesia Other Findings Past Medical History: No date: Anxiety No date: BPH (benign prostatic hyperplasia) No date: Bulging lumbar disc No date: GERD (gastroesophageal reflux disease) No date: Hepatitis B     Comment:  a.) on tenofovir alafenamide No date: Pre-diabetes  Past Surgical History: 2005: CERVICAL DISCECTOMY 2021: COLONOSCOPY WITH ESOPHAGOGASTRODUODENOSCOPY (EGD) No date: INGUINAL HERNIA REPAIR; Bilateral No date: KNEE ARTHROSCOPY; Right     Comment:  meniscus repair No date: LIPOMA EXCISION     Comment:  neck/skull 08/10/2021: LUMBAR DISC SURGERY; Left     Comment:  L4-5 far lateral discectomy 01/10/2018: LUMBAR DISC SURGERY; Left     Comment:  L4-5 lumbar discetomy and laminectomy 1971: TONSILLECTOMY  BMI    Body Mass Index: 29.82 kg/m      Reproductive/Obstetrics negative OB ROS                             Anesthesia Physical Anesthesia Plan  ASA: 2  Anesthesia Plan: General    Post-op Pain Management:    Induction: Intravenous  PONV Risk Score and Plan: Ondansetron, Dexamethasone, Midazolam and Treatment may vary due to age or medical condition  Airway Management Planned: Oral ETT  Additional Equipment:   Intra-op Plan:   Post-operative Plan: Extubation in OR  Informed Consent: I have reviewed the patients History and Physical, chart, labs and discussed the procedure including the risks, benefits and alternatives for the proposed anesthesia with the patient or authorized representative who has indicated his/her understanding and acceptance.     Dental Advisory Given  Plan Discussed with: CRNA and Surgeon  Anesthesia Plan Comments:        Anesthesia Quick Evaluation

## 2022-07-24 NOTE — Evaluation (Signed)
Physical Therapy Evaluation Patient Details Name: Troy Shelton MRN: [id number] DOB: [birth year] Today's Date: 07/24/2022  History of Present Illness  Pt admitted for lumbar fusion on L4/L5. Pt is POD 1.  Clinical Impression  Pt is a pleasant 56 year old male who was admitted for lumbar fusion on L4/L5. Pt performs transfers with min assist and ambulation with cga and RW. Pt demonstrates deficits with pain/strength. Would benefit from skilled PT to address above deficits and promote optimal return to PLOF. Currently recommending OP PT once medically cleared to participate.     Recommendations for follow up therapy are one component of a multi-disciplinary discharge planning process, led by the attending physician.  Recommendations may be updated based on patient status, additional functional criteria and insurance authorization.  Follow Up Recommendations Outpatient PT      Assistance Recommended at Discharge Intermittent Supervision/Assistance  Patient can return home with the following  A little help with walking and/or transfers;A little help with bathing/dressing/bathroom;Help with stairs or ramp for entrance    Equipment Recommendations Rolling walker (2 wheels);BSC/3in1  Recommendations for Other Services       Functional Status Assessment Patient has had a recent decline in their functional status and demonstrates the ability to make significant improvements in function in a reasonable and predictable amount of time.     Precautions / Restrictions Precautions Precautions: Fall;Back Precaution Booklet Issued: Yes (comment) Restrictions Weight Bearing Restrictions: No Other Position/Activity Restrictions: back precautions-educated to patient      Mobility  Bed Mobility               General bed mobility comments: NT, received in recliner    Transfers Overall transfer level: Needs assistance Equipment used: Rolling walker (2 wheels) Transfers: Sit to/from  Stand Sit to Stand: Min assist           General transfer comment: needs cues for sequencing. Effortful stand from recliner despite pillow placement. Cues for hand placement    Ambulation/Gait Ambulation/Gait assistance: Min guard Gait Distance (Feet): 40 Feet Assistive device: Rolling walker (2 wheels) Gait Pattern/deviations: Step-to pattern       General Gait Details: ambulated in room, limited by pain and breakfast tray arriving. RW used. Adjusted to patient height.  Stairs            Wheelchair Mobility    Modified Rankin (Stroke Patients Only)       Balance Overall balance assessment: Modified Independent                                           Pertinent Vitals/Pain Pain Assessment Pain Assessment: 0-10 Pain Score: 4  Pain Location: R side of back Pain Descriptors / Indicators: Operative site guarding Pain Intervention(s): Limited activity within patient's tolerance, Premedicated before session, Repositioned    Home Living Family/patient expects to be discharged to:: Private residence Living Arrangements: Spouse/significant other;Children Available Help at Discharge: Available 24 hours/day;Family Type of Home: House Home Access: Stairs to enter Entrance Stairs-Rails: Can reach both Entrance Stairs-Number of Steps: 3   Home Layout: One level Home Equipment: None      Prior Function Prior Level of Function : Independent/Modified Independent;Driving             Mobility Comments: very indep and working ADLs Comments: indep     Hand Dominance        Extremity/Trunk Assessment  Upper Extremity Assessment Upper Extremity Assessment: Overall WFL for tasks assessed    Lower Extremity Assessment Lower Extremity Assessment: Generalized weakness (R LE grossly 4/5; L LE grossly 5/5)       Communication   Communication: No difficulties  Cognition Arousal/Alertness: Awake/alert Behavior During Therapy: WFL for tasks  assessed/performed Overall Cognitive Status: Within Functional Limits for tasks assessed                                          General Comments      Exercises Other Exercises Other Exercises: educated on back precautions   Assessment/Plan    PT Assessment Patient needs continued PT services  PT Problem List Decreased strength;Decreased balance;Decreased mobility;Decreased activity tolerance;Pain       PT Treatment Interventions DME instruction;Gait training;Stair training;Therapeutic exercise;Balance training    PT Goals (Current goals can be found in the Care Plan section)  Acute Rehab PT Goals Patient Stated Goal: to go back to work PT Goal Formulation: With patient Time For Goal Achievement: 08/07/22 Potential to Achieve Goals: Good    Frequency 7X/week     Co-evaluation               AM-PAC PT "6 Clicks" Mobility  Outcome Measure Help needed turning from your back to your side while in a flat bed without using bedrails?: A Little Help needed moving from lying on your back to sitting on the side of a flat bed without using bedrails?: A Little Help needed moving to and from a bed to a chair (including a wheelchair)?: A Little Help needed standing up from a chair using your arms (e.g., wheelchair or bedside chair)?: A Little Help needed to walk in hospital room?: A Little Help needed climbing 3-5 steps with a railing? : A Little 6 Click Score: 18    End of Session   Activity Tolerance: Patient limited by pain Patient left: in chair Nurse Communication: Mobility status PT Visit Diagnosis: Muscle weakness (generalized) (M62.81);Difficulty in walking, not elsewhere classified (R26.2);Pain Pain - Right/Left: Right Pain - part of body:  (back)    Time: 5003-7048 PT Time Calculation (min) (ACUTE ONLY): 14 min   Charges:   PT Evaluation $PT Eval Low Complexity: 1 Low          Greggory Stallion, PT, DPT,  [facility] [phone]   Troy Shelton 07/24/2022, 9:37 AM

## 2022-07-24 NOTE — Anesthesia Postprocedure Evaluation (Signed)
Anesthesia Post Note  Patient: Encarnacion Slates  Procedure(s) Performed: L4-5 LATERAL LUMBAR INTERBODY FUSION WITH POSTERIOR SPINAL FUSION LEFT L4-5 LUMBAR DECOMPRESSION WITH MET-RX (Left) APPLICATION OF INTRAOPERATIVE CT SCAN  Patient location during evaluation: PACU Anesthesia Type: General Level of consciousness: awake and alert Pain management: pain level controlled Vital Signs Assessment: post-procedure vital signs reviewed and stable Respiratory status: spontaneous breathing, nonlabored ventilation, respiratory function stable and patient connected to nasal cannula oxygen Cardiovascular status: blood pressure returned to baseline and stable Postop Assessment: no apparent nausea or vomiting Anesthetic complications: no   No notable events documented.   Last Vitals:  Vitals:   07/24/22 0111 07/24/22 0422  BP: 120/76 135/82  Pulse: 87 63  Resp: 18 18  Temp: 36.5 C 36.6 C  SpO2: 96% 97%    Last Pain:  Vitals:   07/24/22 0111  TempSrc: Oral  PainSc:                  Martha Clan

## 2022-07-24 NOTE — Progress Notes (Signed)
    Durable Medical Equipment  (From admission, onward)           Start     Ordered   07/24/22 1442  For home use only DME Bedside commode  Once       Question:  Patient needs a bedside commode to treat with the following condition  Answer:  Lumbar radiculopathy   07/24/22 1441   07/24/22 1441  For home use only DME Walker rolling  Once       Question Answer Comment  Walker: With [place]   Patient needs a walker to treat with the following condition Lumbar radiculopathy      07/24/22 1441

## 2022-07-24 NOTE — Plan of Care (Signed)
  Problem: Education: Goal: Ability to verbalize activity precautions or restrictions will improve Outcome: Progressing Goal: Knowledge of the prescribed therapeutic regimen will improve Outcome: Progressing   Problem: Activity: Goal: Ability to avoid complications of mobility impairment will improve Outcome: Progressing Goal: Ability to tolerate increased activity will improve Outcome: Progressing Goal: Will remain free from falls Outcome: Progressing   Problem: Bowel/Gastric: Goal: Gastrointestinal status for postoperative course will improve Outcome: Progressing   Problem: Clinical Measurements: Goal: Ability to maintain clinical measurements within normal limits will improve Outcome: Progressing   Problem: Pain Management: Goal: Pain level will decrease Outcome: Progressing

## 2022-07-24 NOTE — Progress Notes (Signed)
    Attending Progress Note  History: Troy Shelton is here for lumbar radiculopathy and recurrent disc herniation.  POD1: Having back pain  Physical Exam: Vitals:   07/24/22 0422 07/24/22 0809  BP: 135/82 (!) 137/92  Pulse: 63 63  Resp: 18 17  Temp: 97.8 F (36.6 C) 98.2 F (36.8 C)  SpO2: 97% 99%    AA Ox3 CNI  Strength:5/5 throughout Ble except R IP (4+ due to pain)  Data:  Other tests/results: n/a  Drain - 25 documented but some is in the chamber  Assessment/Plan:  Troy Shelton is doing well after surgery with expected postoperative pain  - mobilize - pain control - DVT prophylaxis - PTOT - Continue drain   Meade Maw MD, [facility] Department of Neurosurgery

## 2022-07-24 NOTE — Evaluation (Signed)
Occupational Therapy Evaluation Patient Details Name: Troy Shelton MRN: [id number] DOB: September 22, 1965 Today's Date: 07/24/2022   History of Present Illness Pt admitted for lumbar fusion on L4/L5. Pt is POD 1.   Clinical Impression   Pt supine in bed -  Very pleasant and motivated . Little dizzy because of pain medication and decline to walk. Pt ed on back protection principles and use in ADL's and IADL's- like his garage and workshop. Pt also works at [employer] and [employer] in [place] long distances in one day. Pt show decrease LB ADL"s and was ed and demo use of AE trng  for shoes, socks and pants. Recommend for pt and info provided for hip/knee kit on [employer]. Pt can benefit from skilled OT services to review and ed on use of AE for LB dressing and bathing as well as funcitonal mobility in ADL's and following back protection principles .      Recommendations for follow up therapy are one component of a multi-disciplinary discharge planning process, led by the attending physician.  Recommendations may be updated based on patient status, additional functional criteria and insurance authorization.   Follow Up Recommendations        Assistance Recommended at Discharge    Patient can return home with the following      Functional Status Assessment     Equipment Recommendations       Recommendations for Other Services       Precautions / Restrictions Precautions Precautions: Fall;Back Precaution Booklet Issued: Yes (comment) Restrictions Weight Bearing Restrictions: No Other Position/Activity Restrictions: back precautions-educated to patient      Mobility Bed Mobility               General bed mobility comments: NT- pt had pain meds fels little dizzy    Transfers                   General transfer comment: NT- pt got pain meds - felt little dizzy      Balance                                           ADL either performed or assessed with clinical  judgement   ADL                                         General ADL Comments: UB dressing and grooming Ind with setup - LB dressing max A - pt ed on use of AE for shoes, socks and pants - info provided and demonstrated . 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     Vision Patient Visual Report: No change from baseline       Perception     Praxis      Pertinent Vitals/Pain Pain Assessment Pain Assessment: 0-10 Pain Score: 5      Hand Dominance     Extremity/Trunk Assessment Upper Extremity Assessment Upper Extremity Assessment: Overall WFL for tasks assessed   Lower Extremity Assessment Lower Extremity Assessment: Generalized weakness (R LE grossly 4/5; L LE grossly 5/5)       Communication Communication Communication: No difficulties  Cognition Arousal/Alertness: Awake/alert Behavior During Therapy: WFL for tasks assessed/performed Overall Cognitive Status: Within Functional Limits for tasks assessed                                       General Comments  NT- to be assess tomorrow    Exercises     Shoulder Instructions      Home Living Family/patient expects to be discharged to:: Private residence Living Arrangements: Spouse/significant other;Children Available Help at Discharge: Available 24 hours/day;Family Type of Home: House Home Access: Stairs to enter Technical brewer of Steps: 3 Entrance Stairs-Rails: Can reach both Home Layout: One level     Bathroom Shower/Tub: Gaffer (with seat)   Bathroom Toilet: Standard Bathroom Accessibility: No   Home Equipment: None          Prior Functioning/Environment Prior Level of Function : Independent/Modified Independent;Driving             Mobility Comments: very indep and working ADLs Comments: indep        OT Problem  List: Decreased strength;Pain;Decreased range of motion;Decreased knowledge of use of DME or AE;Decreased activity tolerance      OT Treatment/Interventions: Self-care/ADL training;DME and/or AE instruction;Patient/family education    OT Goals(Current goals can be found in the care plan section) Acute Rehab OT Goals Patient Stated Goal: Got home OT Goal Formulation: With patient Time For Goal Achievement: 08/06/22 Potential to Achieve Goals: Good  OT Frequency: Min 1X/week    Co-evaluation              AM-PAC OT "6 Clicks" Daily Activity     Outcome Measure Help from another person eating meals?: None Help from another person taking care of personal grooming?: None Help from another person toileting, which includes using toliet, bedpan, or urinal?: A Little Help from another person bathing (including washing, rinsing, drying)?: A Little Help from another person to put on and taking off regular upper body clothing?: None Help from another person to put on and taking off regular lower body clothing?: A Lot 6 Click Score: 20   End of Session    Activity Tolerance: Patient tolerated treatment well;Patient limited by lethargy Patient left: in bed;with call bell/phone within reach  OT Visit Diagnosis: Muscle weakness (generalized) (M62.81);Pain Pain - part of body:  (back)                Time: 1045-1110 OT Time Calculation (min): 25 min Charges:  OT General Charges $OT Visit: 1 Visit OT Evaluation $OT Eval Low Complexity: 1 Low OT Treatments $Self Care/Home Management : 8-22 mins    Nicoletta Hush OTR/L,CLT 07/24/2022, 1:06 PM

## 2022-07-24 NOTE — Plan of Care (Signed)
M

## 2022-07-25 MED ORDER — OXYCODONE HCL 5 MG PO TABS: 5.0000 mg | ORAL_TABLET | ORAL | 0 refills | Status: DC | PRN

## 2022-07-25 MED ORDER — CELECOXIB 200 MG PO CAPS: 200.0000 mg | ORAL_CAPSULE | Freq: Two times a day (BID) | ORAL | 0 refills | Status: DC

## 2022-07-25 MED ORDER — CELECOXIB 200 MG PO CAPS
200.0000 mg | ORAL_CAPSULE | Freq: Two times a day (BID) | ORAL | Status: DC
  Administered 2022-07-25: 200 mg via ORAL
  Filled 2022-07-25: qty 1

## 2022-07-25 MED ORDER — DOCUSATE SODIUM 100 MG PO CAPS: 100.0000 mg | ORAL_CAPSULE | Freq: Two times a day (BID) | ORAL | 0 refills | Status: DC

## 2022-07-25 MED ORDER — METHOCARBAMOL 500 MG PO TABS: 500.0000 mg | ORAL_TABLET | Freq: Four times a day (QID) | ORAL | 0 refills | Status: DC | PRN

## 2022-07-25 NOTE — Discharge Summary (Signed)
Physician Discharge Summary  Patient ID: Troy Shelton MRN: [id number] DOB/AGE: 10-Jul-1966 56 y.o.  Admit date: 07/23/2022 Discharge date: 07/25/2022  Admission Diagnoses:Principal Problem:   S/P lumbar fusion Active Problems:   Recurrent herniation of lumbar disc   Lumbar radiculopathy   Chronic left-sided low back pain with left-sided sciatica  Discharge Diagnoses:  Principal Problem:   S/P lumbar fusion Active Problems:   Recurrent herniation of lumbar disc   Lumbar radiculopathy   Chronic left-sided low back pain with left-sided sciatica   Discharged Condition: good  Hospital Course: [name] presented to the hospital with a recurrent disc herniation causing severe lumbar radiculopathy as well as back pain.  He underwent surgical correction and tolerated the procedure well.  His pain was under good control and he was mobilizing appropriately on postoperative day 2.  Consults: None  Significant Diagnostic Studies: radiology: X-Ray: confirmation of implant placement  Treatments: surgery: L4-5 XLIF/PSF with L L4-5 decompression  Discharge Exam: Blood pressure 107/72, pulse 79, temperature (!) 97.4 F (36.3 C), resp. rate 20, height '6\' 1"'$  (1.854 m), weight 102.5 kg, SpO2 97 %. General appearance: alert and cooperative CNI MAEW with mild L IP irritation causing 4+/5 strength  Disposition: Discharge disposition: 01-Home or Self Care       Discharge Instructions     Discharge patient   Complete by: As directed    Discharge disposition: 01-Home or Self Care   Discharge patient date: 07/25/2022   Incentive spirometry RT   Complete by: As directed       Allergies as of 07/25/2022   No Known Allergies      Medication List     TAKE these medications    celecoxib 200 MG capsule Commonly known as: CELEBREX Take 1 capsule (200 mg total) by mouth 2 (two) times daily.   docusate sodium 100 MG capsule Commonly known as: COLACE Take 1 capsule (100 mg  total) by mouth 2 (two) times daily.   methocarbamol 500 MG tablet Commonly known as: ROBAXIN Take 1 tablet (500 mg total) by mouth every 6 (six) hours as needed for muscle spasms.   oxyCODONE 5 MG immediate release tablet Commonly known as: Oxy IR/ROXICODONE Take 1-2 tablets (5-10 mg total) by mouth every 4 (four) hours as needed for moderate pain or severe pain ((score 4 to 6)).   pantoprazole 40 MG tablet Commonly known as: PROTONIX Take 40 mg by mouth in the morning.   sertraline 100 MG tablet Commonly known as: ZOLOFT Take 100 mg by mouth in the morning.   tamsulosin 0.4 MG Caps capsule Commonly known as: FLOMAX Take 0.4 mg by mouth daily.   Vemlidy 25 MG Tabs Generic drug: Tenofovir Alafenamide Fumarate Take 25 mg by mouth in the morning.               Durable Medical Equipment  (From admission, onward)           Start     Ordered   07/24/22 1442  For home use only DME Bedside commode  Once       Question:  Patient needs a bedside commode to treat with the following condition  Answer:  Lumbar radiculopathy   07/24/22 1441   07/24/22 1441  For home use only DME Walker rolling  Once       Question Answer Comment  Walker: With [place]   Patient needs a walker to treat with the following condition Lumbar radiculopathy      07/24/22  [place]     Loleta Dicker, PA Follow up on 08/05/2022.   Specialty: Neurosurgery Why: [name] information: [address] [place] [address] [phone]                 Signed: Meade Maw 07/25/2022, 11:23 AM

## 2022-07-25 NOTE — Plan of Care (Signed)
  Problem: Education: Goal: Ability to verbalize activity precautions or restrictions will improve Outcome: Progressing Goal: Knowledge of the prescribed therapeutic regimen will improve Outcome: Progressing Goal: Understanding of discharge needs will improve Outcome: Progressing   Problem: Activity: Goal: Ability to avoid complications of mobility impairment will improve Outcome: Progressing   Problem: Bowel/Gastric: Goal: Gastrointestinal status for postoperative course will improve Outcome: Progressing   Problem: Pain Management: Goal: Pain level will decrease Outcome: Progressing   Problem: Skin Integrity: Goal: Will show signs of wound healing Outcome: Progressing   Problem: Clinical Measurements: Goal: Will remain free from infection Outcome: Progressing

## 2022-07-25 NOTE — Discharge Instructions (Signed)
Your surgeon has performed an operation on your lumbar spine (low back) to relieve pressure on one or more nerves. Many times, patients feel better immediately after surgery and can "overdo it." Even if you feel well, it is important that you follow these activity guidelines. If you do not let your back heal properly from the surgery, you can increase the chance of a disc herniation and/or return of your symptoms. The following are instructions to help in your recovery once you have been discharged from the hospital.  * It is ok to take celebrex but not other NSAIDs after surgery.  Activity    No bending, lifting, or twisting ("BLT"). Avoid lifting objects heavier than 10 pounds (gallon milk jug).  Where possible, avoid household activities that involve lifting, bending, pushing, or pulling such as laundry, vacuuming, grocery shopping, and childcare. Try to arrange for help from friends and family for these activities while your back heals.  Increase physical activity slowly as tolerated.  Taking short walks is encouraged, but avoid strenuous exercise. Do not jog, run, bicycle, lift weights, or participate in any other exercises unless specifically allowed by your doctor. Avoid prolonged sitting, including car rides.  Talk to your doctor before resuming sexual activity.  You should not drive until cleared by your doctor.  Until released by your doctor, you should not return to work or school.  You should rest at home and let your body heal.   You may shower two days after your surgery.  After showering, lightly dab your incision dry. Do not take a tub bath or go swimming for 3 weeks, or until approved by your doctor at your follow-up appointment.  If you smoke, we strongly recommend that you quit.  Smoking has been proven to interfere with normal healing in your back and will dramatically reduce the success rate of your surgery. Please contact [facility] (800-QUIT-NOW) and use the resources at  www.[facility].com for assistance in stopping smoking.  Surgical Incision   If you have a dressing on your incision, you may remove it three days after your surgery. Keep your incision area clean and dry.  If you have staples or stitches on your incision, you should have a follow up scheduled for removal. If you do not have staples or stitches, you will have steri-strips (small pieces of surgical tape) or Dermabond glue. The steri-strips/glue should begin to peel away within about a week (it is fine if the steri-strips fall off before then).   Diet            You may return to your usual diet. Be sure to stay hydrated.  When to Contact us  Although your surgery and recovery will likely be uneventful, you may have some residual numbness, aches, and pains in your back and/or legs. This is normal and should improve in the next few weeks.  However, should you experience any of the following, contact us immediately: New numbness or weakness Pain that is progressively getting worse, and is not relieved by your pain medications or rest Bleeding, redness, swelling, pain, or drainage from surgical incision Chills or flu-like symptoms Fever greater than 101.0 F (38.3 C) Problems with bowel or bladder functions Difficulty breathing or shortness of breath Warmth, tenderness, or swelling in your calf  Contact Information During office hours (Monday-Friday 9 am to 5 pm), please call your physician at [phone] and ask for Berdine Addison After hours and weekends, please call [phone] and speak with the neurosurgeon on call For  a life-threatening emergency, call 911

## 2022-07-25 NOTE — Progress Notes (Signed)
Physical Therapy Treatment Patient Details Name: Troy Shelton MRN: [id number] DOB: [birth year] Today's Date: 07/25/2022   History of Present Illness Pt admitted for lumbar fusion on L4/L5. Pt is POD 1.    PT Comments    In chair.  Walked to [employer] for stair training.  Able to complete with Min guard.  No further questions or concerns noted.  Anticipated discharge home today.   Recommendations for follow up therapy are one component of a multi-disciplinary discharge planning process, led by the attending physician.  Recommendations may be updated based on patient status, additional functional criteria and insurance authorization.  Follow Up Recommendations  Outpatient PT     Assistance Recommended at Discharge Intermittent Supervision/Assistance  Patient can return home with the following A little help with walking and/or transfers;A little help with bathing/dressing/bathroom;Help with stairs or ramp for entrance   Equipment Recommendations  Rolling walker (2 wheels);BSC/3in1    Recommendations for Other Services       Precautions / Restrictions Precautions Precautions: Fall;Back Precaution Booklet Issued: Yes (comment) Restrictions Weight Bearing Restrictions: No Other Position/Activity Restrictions: back precautions-educated to patient     Mobility  Bed Mobility               General bed mobility comments: in recliner.  no dizziness    Transfers Overall transfer level: Needs assistance Equipment used: Rolling walker (2 wheels) Transfers: Sit to/from Stand Sit to Stand: Min guard           General transfer comment: some hesitance standing fully upright but no assist needed    Ambulation/Gait Ambulation/Gait assistance: Min guard, Supervision Gait Distance (Feet): 300 Feet Assistive device: Rolling walker (2 wheels) Gait Pattern/deviations: Step-through pattern Gait velocity: dec         Stairs Stairs: Yes Stairs assistance: Min guard Stair  Management: Two rails, Forwards Number of Stairs: 4 General stair comments: does well   Wheelchair Mobility    Modified Rankin (Stroke Patients Only)       Balance Overall balance assessment: Needs assistance Sitting-balance support: Feet supported Sitting balance-Leahy Scale: Good     Standing balance support: Bilateral upper extremity supported Standing balance-Leahy Scale: Good                              Cognition Arousal/Alertness: Awake/alert Behavior During Therapy: WFL for tasks assessed/performed Overall Cognitive Status: Within Functional Limits for tasks assessed                                          Exercises      General Comments        Pertinent Vitals/Pain Pain Assessment Pain Assessment: Faces Faces Pain Scale: Hurts a little bit Pain Location: R side of back Pain Descriptors / Indicators: Operative site guarding Pain Intervention(s): Monitored during session, Limited activity within patient's tolerance, Repositioned    Home Living                          Prior Function            PT Goals (current goals can now be found in the care plan section) Progress towards PT goals: Progressing toward goals    Frequency    7X/week      PT Plan      Co-evaluation  AM-PAC PT "6 Clicks" Mobility   Outcome Measure  Help needed turning from your back to your side while in a flat bed without using bedrails?: None Help needed moving from lying on your back to sitting on the side of a flat bed without using bedrails?: None Help needed moving to and from a bed to a chair (including a wheelchair)?: A Little Help needed standing up from a chair using your arms (e.g., wheelchair or bedside chair)?: A Little Help needed to walk in hospital room?: A Little Help needed climbing 3-5 steps with a railing? : A Little 6 Click Score: 20    End of Session   Activity Tolerance: Patient limited  by pain Patient left: in chair Nurse Communication: Mobility status PT Visit Diagnosis: Muscle weakness (generalized) (M62.81);Difficulty in walking, not elsewhere classified (R26.2);Pain Pain - Right/Left: Right Pain - part of body:  (back)     Time: 5885-0277 PT Time Calculation (min) (ACUTE ONLY): 15 min  Charges:  $Gait Training: 8-22 mins                    Chesley Noon, PTA 07/25/22, 9:19 AM

## 2022-07-25 NOTE — TOC Transition Note (Signed)
Transition of Care [facility]) - CM/SW Discharge Note   Patient Details  Name: Amanuel Sinkfield MRN: [id number] Date of Birth: Sep 16, 1965  [facility] of [facility]) CM/SW Contact:  Izola Price, RN Phone Number: 07/25/2022, 12:38 PM   Clinical Narrative: 12/24: Patient being discharged today. Spoke with patient yesterday about outpatient therapy choices and he wishes to go back to [facility] on [facility] [address]. Faxed progress/order note via EPIC and hard copy faxed as well. Awaiting DME ordered via [employer] yesterday to be delivered this am. Re-confirmed with [employer] that is is out for delivery around 1130 am. Family transportation to home. Simmie Davies RN CM           Final next level of care: OP Rehab     Patient Goals and CMS Choice      Discharge Placement                         Discharge Plan and Services Additional resources added to the After Visit Summary for                  DME Arranged: Walker rolling, Bedside commode DME Agency: [employer] Date DME Agency Contacted: 07/24/22 Time DME Agency Contacted: 1430 Representative spoke with at DME Agency: [place]: NA [place] Agency: NA        Social Determinants of Health ([place]) Interventions SDOH Screenings   Tobacco Use: Medium Risk (07/23/2022)     Readmission Risk Interventions     No data to display

## 2022-07-25 NOTE — Progress Notes (Addendum)
Occupational Therapy * Physical Therapy * Speech Therapy  DATE: 12/24 PATIENT NAME: Troy Shelton  PATIENT MRN: [id number]  DIAGNOSIS/DIAGNOSIS CODE: Z98.1 DATE OF DISCHARGE: 07/25/22  PRIMARY CARE PHYSICIAN: PCP (See surgeon's orders below).  PCP PHONE/FAX: NA   Provider (Dr. Yarbrough/Neurosurgeon): [phone] PHONE:   [facility] [address], [place] [address] Phone: [phone] Fax: [phone]    I certify that I have examined this patient and that occupational/physical/speech therapy is necessary on an outpatient basis.    The patient has expressed interest in completing their recommended course of therapy at your location.  Once a formal order from the patient's primary care physician has been obtained, please contact him/her to schedule an appointment for evaluation at your earliest convenience.  [ x ]  Physical Therapy Evaluate and Treat  [ x]  Occupational Therapy Evaluate and Treat  [  ]  Speech Therapy Evaluate and Treat  The patient's primary care physician (listed above) must furnish and be responsible for a formal order such that the recommended services may be furnished while under the primary physician's care, and that the plan of care will be established and reviewed every 30 days (or more often if condition necessitates).

## 2022-07-27 ENCOUNTER — Encounter: Payer: Self-pay | Admitting: Neurosurgery

## 2022-08-04 NOTE — Progress Notes (Signed)
   REFERRING PHYSICIAN:  No referring provider defined for this encounter.  DOS:  07/23/22 L4-5 XLIF/PSF with Left L4-5 decompression   HISTORY OF PRESENT ILLNESS: Troy Shelton is approximately 2 status post  L4-5 XLIF/PSF with Left L4-5 decompression . Was given celebrex, robaxin, oxycodone on discharge from the hospital.   He feels better than he did preop- his left leg pain is better!  He has some right buttock pain with some radiation into his thigh. This is improving.   He taking prn celebrex/robaxin. Not taking much oxycodone.    PHYSICAL EXAMINATION:  General: Patient is well developed, well nourished, calm, collected, and in no apparent distress.   NEUROLOGICAL:  General: In no acute distress.   Awake, alert, oriented to person, place, and time.  Pupils equal round and reactive to light.  Facial tone is symmetric.     Strength:            Side Iliopsoas Quads Hamstring PF DF EHL  R '5 5 5 5 5 5  '$ L '5 5 5 5 5 5   '$ Incisions c/d/i   ROS (Neurologic):  Negative except as noted above  IMAGING: Nothing new to review.   ASSESSMENT/PLAN:  Troy Shelton is doing well s/p above surgery. 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 Treatment options reviewed with patient and following plan made:   - I have advised the patient to lift up to 10 pounds until 6 weeks after surgery (follow up with Dr. Izora Ribas).  - Reviewed wound care.  - No bending, twisting, or lifting.  - Continue on current medications including celebrex, robaxin, and prn oxycodone. No refills needed.  - Follow up as scheduled in 4 weeks and prn.   Advised to contact the office if any questions or concerns arise.  Geronimo Boot PA-C Department of neurosurgery

## 2022-08-05 ENCOUNTER — Encounter: Payer: 59 | Admitting: Neurosurgery

## 2022-08-09 ENCOUNTER — Encounter: Payer: Self-pay | Admitting: Orthopedic Surgery

## 2022-08-09 ENCOUNTER — Ambulatory Visit (INDEPENDENT_AMBULATORY_CARE_PROVIDER_SITE_OTHER): Payer: No Typology Code available for payment source | Admitting: Orthopedic Surgery

## 2022-08-09 VITALS — BP 133/91 | HR 78 | Temp 98.7°F

## 2022-08-09 DIAGNOSIS — M5126 Other intervertebral disc displacement, lumbar region: Secondary | ICD-10-CM

## 2022-08-09 DIAGNOSIS — M5416 Radiculopathy, lumbar region: Secondary | ICD-10-CM

## 2022-08-09 DIAGNOSIS — Z09 Encounter for follow-up examination after completed treatment for conditions other than malignant neoplasm: Secondary | ICD-10-CM

## 2022-09-01 ENCOUNTER — Other Ambulatory Visit: Payer: Self-pay

## 2022-09-01 DIAGNOSIS — Z981 Arthrodesis status: Secondary | ICD-10-CM

## 2022-09-02 ENCOUNTER — Ambulatory Visit
Admission: RE | Admit: 2022-09-02 | Discharge: 2022-09-02 | Disposition: A | Discharge status: Home / Self Care | Payer: No Typology Code available for payment source | Source: Ambulatory Visit | Attending: Neurosurgery | Admitting: Neurosurgery

## 2022-09-02 ENCOUNTER — Ambulatory Visit
Admission: RE | Admit: 2022-09-02 | Discharge: 2022-09-02 | Disposition: A | Discharge status: Home / Self Care | Payer: No Typology Code available for payment source | Attending: Neurosurgery | Admitting: Neurosurgery

## 2022-09-02 ENCOUNTER — Encounter: Payer: Self-pay | Admitting: Neurosurgery

## 2022-09-02 ENCOUNTER — Ambulatory Visit: Payer: No Typology Code available for payment source | Admitting: Neurosurgery

## 2022-09-02 VITALS — BP 128/82 | Temp 97.9°F | Ht 73.0 in | Wt 226.0 lb

## 2022-09-02 DIAGNOSIS — Z981 Arthrodesis status: Secondary | ICD-10-CM

## 2022-09-02 DIAGNOSIS — M5416 Radiculopathy, lumbar region: Secondary | ICD-10-CM

## 2022-09-02 DIAGNOSIS — Z09 Encounter for follow-up examination after completed treatment for conditions other than malignant neoplasm: Secondary | ICD-10-CM

## 2022-09-02 DIAGNOSIS — M5126 Other intervertebral disc displacement, lumbar region: Secondary | ICD-10-CM

## 2022-09-02 NOTE — Progress Notes (Signed)
   REFERRING PHYSICIAN:  No referring provider defined for this encounter.  DOS:  07/23/22 L4-5 XLIF/PSF with Left L4-5 decompression   HISTORY OF PRESENT ILLNESS: Troy Shelton is status post  L4-5 XLIF/PSF with Left L4-5 decompression .  He is having some aching around his back, but his symptoms are better than they were prior to surgery.  He is happy with his improvements.    PHYSICAL EXAMINATION:  General: Patient is well developed, well nourished, calm, collected, and in no apparent distress.   NEUROLOGICAL:  General: In no acute distress.   Awake, alert, oriented to person, place, and time.  Pupils equal round and reactive to light.  Facial tone is symmetric.     Strength:            Side Iliopsoas Quads Hamstring PF DF EHL  R '5 5 5 5 5 5  '$ L '5 5 5 5 5 5   '$ Incisions c/d/i   ROS (Neurologic):  Negative except as noted above  IMAGING: No complications noted.  ASSESSMENT/PLAN:  Troy Shelton is doing well s/p above surgery.  He continues to improve.  I am very happy with his progress to this point.  I think he will continue to improve for months.  We will see him back in 6 weeks.  He is now on a 25 pound lifting limit and can start slowly increasing his activity.    Meade Maw MD Department of neurosurgery

## 2022-09-11 ENCOUNTER — Encounter: Payer: Self-pay | Admitting: Neurosurgery

## 2022-10-12 ENCOUNTER — Other Ambulatory Visit: Payer: Self-pay

## 2022-10-12 DIAGNOSIS — Z981 Arthrodesis status: Secondary | ICD-10-CM

## 2022-10-14 ENCOUNTER — Encounter: Payer: Self-pay | Admitting: Neurosurgery

## 2022-10-14 ENCOUNTER — Ambulatory Visit
Admission: RE | Admit: 2022-10-14 | Discharge: 2022-10-14 | Disposition: A | Discharge status: Home / Self Care | Payer: No Typology Code available for payment source | Attending: Neurosurgery | Admitting: Neurosurgery

## 2022-10-14 ENCOUNTER — Ambulatory Visit
Admission: RE | Admit: 2022-10-14 | Discharge: 2022-10-14 | Disposition: A | Discharge status: Home / Self Care | Payer: No Typology Code available for payment source | Source: Ambulatory Visit | Attending: Neurosurgery | Admitting: Neurosurgery

## 2022-10-14 ENCOUNTER — Ambulatory Visit: Payer: No Typology Code available for payment source | Admitting: Neurosurgery

## 2022-10-14 VITALS — BP 115/81 | Temp 98.2°F | Ht 73.0 in | Wt 226.0 lb

## 2022-10-14 DIAGNOSIS — Z981 Arthrodesis status: Secondary | ICD-10-CM

## 2022-10-14 DIAGNOSIS — M5416 Radiculopathy, lumbar region: Secondary | ICD-10-CM

## 2022-10-14 DIAGNOSIS — M5126 Other intervertebral disc displacement, lumbar region: Secondary | ICD-10-CM

## 2022-10-14 DIAGNOSIS — Z09 Encounter for follow-up examination after completed treatment for conditions other than malignant neoplasm: Secondary | ICD-10-CM

## 2022-10-14 NOTE — Progress Notes (Signed)
   REFERRING PHYSICIAN:  Robyne Peers, [address] [place],  [place] [id number]  DOS:  07/23/22 L4-5 XLIF/PSF with Left L4-5 decompression   HISTORY OF PRESENT ILLNESS: Troy Shelton is status post  L4-5 XLIF/PSF with Left L4-5 decompression.  He is having some aching around his back.   He has not been taking any medication for pain.  PHYSICAL EXAMINATION:  General: Patient is well developed, well nourished, calm, collected, and in no apparent distress.   NEUROLOGICAL:  General: In no acute distress.   Awake, alert, oriented to person, place, and time.  Pupils equal round and reactive to light.  Facial tone is symmetric.     Strength:            Side Iliopsoas Quads Hamstring PF DF EHL  R 5 5 5 5 5 5   L 5 5 5 5 5 5    Incisions c/d/i   ROS (Neurologic):  Negative except as noted above  IMAGING: No complications noted.  ASSESSMENT/PLAN:  Troy Shelton is doing well s/p above surgery.  As he has increased his activity, he has had some dull achy pain.  He can start using some NSAIDs to help with his pain relief.  I suspect he will improve with time.  I will see him back in 6 months with x-rays.  He is off all activity limitations.    Meade Maw MD Department of neurosurgery

## 2023-03-30 ENCOUNTER — Other Ambulatory Visit: Payer: Self-pay | Admitting: Family Medicine

## 2023-03-30 DIAGNOSIS — Z981 Arthrodesis status: Secondary | ICD-10-CM

## 2023-04-01 ENCOUNTER — Other Ambulatory Visit: Payer: Self-pay

## 2023-04-01 NOTE — Progress Notes (Signed)
error 

## 2023-04-05 ENCOUNTER — Encounter: Payer: Self-pay | Admitting: Neurosurgery

## 2023-04-05 ENCOUNTER — Ambulatory Visit (INDEPENDENT_AMBULATORY_CARE_PROVIDER_SITE_OTHER): Payer: No Typology Code available for payment source | Admitting: Neurosurgery

## 2023-04-05 ENCOUNTER — Ambulatory Visit
Admission: RE | Admit: 2023-04-05 | Discharge: 2023-04-05 | Disposition: A | Discharge status: Home / Self Care | Payer: No Typology Code available for payment source | Source: Ambulatory Visit | Attending: Neurosurgery | Admitting: Neurosurgery

## 2023-04-05 ENCOUNTER — Ambulatory Visit
Admission: RE | Admit: 2023-04-05 | Discharge: 2023-04-05 | Disposition: A | Discharge status: Home / Self Care | Payer: No Typology Code available for payment source | Attending: Neurosurgery | Admitting: Neurosurgery

## 2023-04-05 VITALS — BP 130/72 | Ht 74.0 in | Wt 226.0 lb

## 2023-04-05 DIAGNOSIS — Z981 Arthrodesis status: Secondary | ICD-10-CM | POA: Diagnosis present

## 2023-04-05 DIAGNOSIS — G8929 Other chronic pain: Secondary | ICD-10-CM | POA: Diagnosis not present

## 2023-04-05 DIAGNOSIS — M5442 Lumbago with sciatica, left side: Secondary | ICD-10-CM

## 2023-04-05 NOTE — Progress Notes (Signed)
   REFERRING PHYSICIAN:  Ellan Lambert, Md [address] [place],  [place] [address]  DOS:  07/23/22 L4-5 XLIF/PSF with Left L4-5 decompression   HISTORY OF PRESENT ILLNESS: Chalon Elia is status post  L4-5 XLIF/PSF with Left L4-5 decompression.  At her last visit, he was doing very well.  Over the last 2 months, he has had increasing pain around his left lower back.  He did something but cannot remember exactly what happened.  When he stands, his pain is somewhat better.     PHYSICAL EXAMINATION:  General: Patient is well developed, well nourished, calm, collected, and in no apparent distress.   NEUROLOGICAL:  General: In no acute distress.   Awake, alert, oriented to person, place, and time.  Pupils equal round and reactive to light.  Facial tone is symmetric.     Strength:            Side Iliopsoas Quads Hamstring PF DF EHL  R 5 5 5 5 5 5   L 5 5 5 5 5 5    Incisions c/d/I  TTP around L SI joint   ROS (Neurologic):  Negative except as noted above  IMAGING: No complications noted.  ASSESSMENT/PLAN:  Reese Miland is having some new left lower back pain.  This could be secondary to sacroiliitis, or some muscular cause.  Additionally, it is possible that he has adjacent segment disease or recurrent lumbar radiculopathy.  I would like to start him back on some physical therapy.  If he is not better after 6 weeks of physical therapy, we will proceed with reimaging.    I spent a total of 15 minutes in this patient's care today. This time was spent reviewing pertinent records including imaging studies, obtaining and confirming history, performing a directed evaluation, formulating and discussing my recommendations, and documenting the visit within the medical record.     Venetia Night MD Department of neurosurgery

## 2023-09-14 ENCOUNTER — Other Ambulatory Visit: Payer: Self-pay | Admitting: Specialist

## 2023-09-14 DIAGNOSIS — K7460 Unspecified cirrhosis of liver: Secondary | ICD-10-CM

## 2023-09-14 DIAGNOSIS — B181 Chronic viral hepatitis B without delta-agent: Secondary | ICD-10-CM

## 2023-09-14 DIAGNOSIS — Z860100 Personal history of colon polyps, unspecified: Secondary | ICD-10-CM

## 2023-09-16 ENCOUNTER — Ambulatory Visit
Admission: RE | Admit: 2023-09-16 | Discharge: 2023-09-16 | Disposition: A | Discharge status: Home / Self Care | Payer: 59 | Source: Ambulatory Visit | Attending: Specialist | Admitting: Specialist

## 2023-09-16 DIAGNOSIS — K7460 Unspecified cirrhosis of liver: Secondary | ICD-10-CM

## 2023-09-16 DIAGNOSIS — Z860100 Personal history of colon polyps, unspecified: Secondary | ICD-10-CM

## 2023-09-16 DIAGNOSIS — B181 Chronic viral hepatitis B without delta-agent: Secondary | ICD-10-CM

## 2024-08-21 ENCOUNTER — Other Ambulatory Visit: Payer: Self-pay | Admitting: Specialist

## 2024-08-21 DIAGNOSIS — B181 Chronic viral hepatitis B without delta-agent: Secondary | ICD-10-CM

## 2024-08-21 DIAGNOSIS — R7989 Other specified abnormal findings of blood chemistry: Secondary | ICD-10-CM

## 2024-10-01 ENCOUNTER — Other Ambulatory Visit
